# Patient Record
Sex: Male | Born: 1940 | Race: White | Hispanic: No | Marital: Married | Smoking: Heavy tobacco smoker
Health system: Southern US, Community
[De-identification: ages and names within clinical notes are randomized; demographics above are authoritative.]

## PROBLEM LIST (undated history)

## (undated) DIAGNOSIS — I10 Essential (primary) hypertension: Secondary | ICD-10-CM

## (undated) DIAGNOSIS — N529 Male erectile dysfunction, unspecified: Secondary | ICD-10-CM

## (undated) DIAGNOSIS — N400 Enlarged prostate without lower urinary tract symptoms: Secondary | ICD-10-CM

## (undated) DIAGNOSIS — C61 Malignant neoplasm of prostate: Secondary | ICD-10-CM

## (undated) DIAGNOSIS — E785 Hyperlipidemia, unspecified: Secondary | ICD-10-CM

## (undated) DIAGNOSIS — E119 Type 2 diabetes mellitus without complications: Secondary | ICD-10-CM

## (undated) HISTORY — DX: Essential (primary) hypertension: I10

## (undated) HISTORY — DX: Type 2 diabetes mellitus without complications: E11.9

## (undated) HISTORY — DX: Malignant neoplasm of prostate: C61

## (undated) HISTORY — PX: ROTATOR CUFF REPAIR: SHX139

## (undated) HISTORY — PX: PROSTATE SURGERY: SHX751

## (undated) HISTORY — DX: Hyperlipidemia, unspecified: E78.5

## (undated) HISTORY — DX: Benign prostatic hyperplasia without lower urinary tract symptoms: N40.0

## (undated) HISTORY — DX: Male erectile dysfunction, unspecified: N52.9

---

## 2003-01-06 ENCOUNTER — Encounter: Payer: Self-pay | Admitting: Orthopedic Surgery

## 2003-01-06 ENCOUNTER — Encounter: Admission: RE | Admit: 2003-01-06 | Discharge: 2003-01-06 | Payer: Self-pay | Admitting: Orthopedic Surgery

## 2010-07-16 ENCOUNTER — Ambulatory Visit: Admission: RE | Admit: 2010-07-16 | Discharge: 2010-09-08 | Payer: Self-pay | Admitting: Radiation Oncology

## 2010-10-12 LAB — HM DIABETES EYE EXAM

## 2011-03-13 DIAGNOSIS — C61 Malignant neoplasm of prostate: Secondary | ICD-10-CM

## 2011-03-13 HISTORY — DX: Malignant neoplasm of prostate: C61

## 2011-07-13 LAB — FECAL OCCULT BLOOD, GUAIAC: Fecal Occult Blood: NEGATIVE

## 2012-03-12 LAB — HM COLONOSCOPY: HM Colonoscopy: NEGATIVE

## 2012-09-23 ENCOUNTER — Ambulatory Visit: Payer: Self-pay | Admitting: Urology

## 2012-10-26 LAB — HM DIABETES FOOT EXAM

## 2012-10-26 LAB — BASIC METABOLIC PANEL
BUN: 18 mg/dL (ref 4–21)
Creatinine: 0.8 mg/dL (ref <=1.3)

## 2012-10-26 LAB — LIPID PANEL
HDL: 42 mg/dL (ref 35–70)
LDL Cholesterol: 53 mg/dL
Triglycerides: 77 mg/dL (ref 40–160)

## 2012-10-26 LAB — HEMOGLOBIN A1C: Hgb A1c MFr Bld: 7 % — AB (ref 4.0–6.0)

## 2012-12-17 ENCOUNTER — Encounter: Payer: Self-pay | Admitting: Nurse Practitioner

## 2012-12-17 DIAGNOSIS — N529 Male erectile dysfunction, unspecified: Secondary | ICD-10-CM

## 2012-12-17 DIAGNOSIS — E785 Hyperlipidemia, unspecified: Secondary | ICD-10-CM | POA: Insufficient documentation

## 2012-12-17 DIAGNOSIS — I10 Essential (primary) hypertension: Secondary | ICD-10-CM | POA: Insufficient documentation

## 2012-12-17 DIAGNOSIS — E119 Type 2 diabetes mellitus without complications: Secondary | ICD-10-CM

## 2012-12-17 DIAGNOSIS — N400 Enlarged prostate without lower urinary tract symptoms: Secondary | ICD-10-CM | POA: Insufficient documentation

## 2013-01-04 ENCOUNTER — Telehealth: Payer: Self-pay | Admitting: Nurse Practitioner

## 2013-01-04 NOTE — Telephone Encounter (Signed)
Wants verbal to fill topical cream for neuropathy.

## 2013-01-25 ENCOUNTER — Ambulatory Visit: Payer: Self-pay | Admitting: Nurse Practitioner

## 2013-04-25 ENCOUNTER — Other Ambulatory Visit: Payer: Self-pay | Admitting: Nurse Practitioner

## 2013-04-27 NOTE — Telephone Encounter (Signed)
Last seen 10/26/12

## 2013-05-23 ENCOUNTER — Other Ambulatory Visit: Payer: Self-pay | Admitting: Nurse Practitioner

## 2013-05-24 ENCOUNTER — Telehealth: Payer: Self-pay | Admitting: Nurse Practitioner

## 2013-05-24 NOTE — Telephone Encounter (Signed)
Wants 90 day supply, hasnt been seen since 01/14

## 2013-05-25 NOTE — Telephone Encounter (Signed)
i reviewed patients paper chart and I do not see where patient is taking Lipitor. I left message for patient to call us back

## 2013-05-25 NOTE — Telephone Encounter (Signed)
Need to know dose- not in epic 

## 2013-05-29 ENCOUNTER — Ambulatory Visit (INDEPENDENT_AMBULATORY_CARE_PROVIDER_SITE_OTHER): Payer: Medicare Other | Admitting: Nurse Practitioner

## 2013-05-29 ENCOUNTER — Encounter: Payer: Self-pay | Admitting: Nurse Practitioner

## 2013-05-29 VITALS — BP 115/70 | HR 72 | Temp 97.4°F | Ht 71.0 in | Wt 176.0 lb

## 2013-05-29 DIAGNOSIS — M25569 Pain in unspecified knee: Secondary | ICD-10-CM

## 2013-05-29 DIAGNOSIS — Z125 Encounter for screening for malignant neoplasm of prostate: Secondary | ICD-10-CM

## 2013-05-29 DIAGNOSIS — E785 Hyperlipidemia, unspecified: Secondary | ICD-10-CM

## 2013-05-29 DIAGNOSIS — I10 Essential (primary) hypertension: Secondary | ICD-10-CM

## 2013-05-29 DIAGNOSIS — E119 Type 2 diabetes mellitus without complications: Secondary | ICD-10-CM

## 2013-05-29 DIAGNOSIS — M25562 Pain in left knee: Secondary | ICD-10-CM

## 2013-05-29 DIAGNOSIS — L989 Disorder of the skin and subcutaneous tissue, unspecified: Secondary | ICD-10-CM

## 2013-05-29 MED ORDER — LISINOPRIL 5 MG PO TABS: 5.0000 mg | ORAL_TABLET | Freq: Every day | ORAL | Status: DC

## 2013-05-29 MED ORDER — SITAGLIPTIN PHOS-METFORMIN HCL 50-1000 MG PO TABS: 1.0000 | ORAL_TABLET | Freq: Two times a day (BID) | ORAL | Status: DC

## 2013-05-29 MED ORDER — GEMFIBROZIL 600 MG PO TABS: 600.0000 mg | ORAL_TABLET | Freq: Two times a day (BID) | ORAL | Status: DC

## 2013-05-29 MED ORDER — GLIMEPIRIDE 4 MG PO TABS: 4.0000 mg | ORAL_TABLET | Freq: Every day | ORAL | Status: DC

## 2013-05-29 NOTE — Progress Notes (Signed)
Subjective:    Patient ID: Gabriel Joyce, male    DOB: Nov 05, 1940, 72 y.o.   MRN: [id number]  Hypertension This is a chronic problem. The current episode started more than 1 year ago. The problem is unchanged. The problem is controlled. Pertinent negatives include no blurred vision, chest pain, headaches, neck pain, palpitations, peripheral edema, shortness of breath or sweats. There are no associated agents to hypertension. Risk factors for coronary artery disease include diabetes mellitus and smoking/tobacco exposure. Past treatments include ACE inhibitors. The current treatment provides moderate improvement. Compliance problems include diet and exercise.  Hypertensive end-organ damage includes CAD/MI.  Hyperlipidemia This is a chronic problem. The current episode started more than 1 year ago. The problem is controlled. Recent lipid tests were reviewed and are normal. Exacerbating diseases include diabetes. He has no history of hypothyroidism, liver disease or obesity. Pertinent negatives include no chest pain or shortness of breath. Current antihyperlipidemic treatment includes fibric acid derivatives. The current treatment provides moderate improvement of lipids. Compliance problems include adherence to diet and adherence to exercise.  Risk factors for coronary artery disease include diabetes mellitus, hypertension and male sex.  Diabetes He presents for his follow-up diabetic visit. He has type 2 diabetes mellitus. No MedicAlert identification noted. The initial diagnosis of diabetes was made 24 years ago. His disease course has been fluctuating. There are no hypoglycemic associated symptoms. Pertinent negatives for hypoglycemia include no headaches or sweats. There are no diabetic associated symptoms. Pertinent negatives for diabetes include no blurred vision and no chest pain. There are no hypoglycemic complications. There are no diabetic complications. Risk factors for coronary artery disease  include dyslipidemia, hypertension, male sex and family history. Current diabetic treatment includes oral agent (triple therapy). He is compliant with treatment most of the time. His weight is stable. He is following a generally healthy diet. When asked about meal planning, he reported none. He has not had a previous visit with a dietician. He rarely participates in exercise. There is no change in his home blood glucose trend. His breakfast blood glucose is taken between 8-9 am. His breakfast blood glucose range is generally 90-110 mg/dl. His overall blood glucose range is 90-110 mg/dl. An ACE inhibitor/angiotensin II receptor blocker is being taken. He does not see a podiatrist.Eye exam is not current (patient to make appointment).      Review of Systems  HENT: Negative for neck pain.   Eyes: Negative for blurred vision.  Respiratory: Negative for shortness of breath.   Cardiovascular: Negative for chest pain and palpitations.  Neurological: Negative for headaches.       Objective:   Physical Exam  Constitutional: He is oriented to person, place, and time. He appears well-developed and well-nourished.  HENT:  Head: Normocephalic.  Right Ear: External ear normal.  Left Ear: External ear normal.  Nose: Nose normal.  Mouth/Throat: Oropharynx is clear and moist.  Eyes: EOM are normal. Pupils are equal, round, and reactive to light.  Neck: Normal range of motion. Neck supple. No thyromegaly present.  Cardiovascular: Normal rate, regular rhythm, normal heart sounds and intact distal pulses.   No murmur heard. Pulmonary/Chest: Effort normal and breath sounds normal. He has no wheezes. He has no rales.  Abdominal: Soft. Bowel sounds are normal.  Genitourinary:  Refused rectal exam a day   Musculoskeletal: Normal range of motion.  FROM of left knee with pain on full ext- mild crepitus No effusion No patella tenderness  Neurological: He is alert and oriented to  person, place, and time.   Skin: Skin is warm and dry.  1cm raised fleshed colored lesion on left side of nose  Psychiatric: He has a normal mood and affect. His behavior is normal. Judgment and thought content normal.   BP 115/70  Pulse 72  Temp(Src) 97.4 F (36.3 C) (Oral)  Ht 5\' 11"  (1.803 m)  Wt 176 lb (79.833 kg)  BMI 24.56 kg/m2        Assessment & Plan:  1. Hypertension Low NA + diet - CMP14+EGFR  2. Hyperlipidemia Low fat diet and exercise - NMR, lipoprofile  3. Type II or unspecified type diabetes mellitus without mention of complication, not stated as uncontrolled Low carb diet - POCT glycosylated hemoglobin (Hb A1C) - POCT UA - Microalbumin  4. Prostate cancer screening  - PSA, total and free  Referral to ortho  For left knee pain Referral to dermatolgy for skin lesion  Mary-Margaret Daphine Deutscher, FNP

## 2013-05-29 NOTE — Patient Instructions (Signed)
Health Maintenance, Males A healthy lifestyle and preventative care can promote health and wellness.  Maintain regular health, dental, and eye exams.  Eat a healthy diet. Foods like vegetables, fruits, whole grains, low-fat dairy products, and lean protein foods contain the nutrients you need without too many calories. Decrease your intake of foods high in solid fats, added sugars, and salt. Get information about a proper diet from your caregiver, if necessary.  Regular physical exercise is one of the most important things you can do for your health. Most adults should get at least 150 minutes of moderate-intensity exercise (any activity that increases your heart rate and causes you to sweat) each week. In addition, most adults need muscle-strengthening exercises on 2 or more days a week.   Maintain a healthy weight. The body mass index (BMI) is a screening tool to identify possible weight problems. It provides an estimate of body fat based on height and weight. Your caregiver can help determine your BMI, and can help you achieve or maintain a healthy weight. For adults 20 years and older:  A BMI below 18.5 is considered underweight.  A BMI of 18.5 to 24.9 is normal.  A BMI of 25 to 29.9 is considered overweight.  A BMI of 30 and above is considered obese.  Maintain normal blood lipids and cholesterol by exercising and minimizing your intake of saturated fat. Eat a balanced diet with plenty of fruits and vegetables. Blood tests for lipids and cholesterol should begin at age 20 and be repeated every 5 years. If your lipid or cholesterol levels are high, you are over 50, or you are a high risk for heart disease, you may need your cholesterol levels checked more frequently.Ongoing high lipid and cholesterol levels should be treated with medicines, if diet and exercise are not effective.  If you smoke, find out from your caregiver how to quit. If you do not use tobacco, do not start.  If you  choose to drink alcohol, do not exceed 2 drinks per day. One drink is considered to be 12 ounces (355 mL) of beer, 5 ounces (148 mL) of wine, or 1.5 ounces (44 mL) of liquor.  Avoid use of street drugs. Do not share needles with anyone. Ask for help if you need support or instructions about stopping the use of drugs.  High blood pressure causes heart disease and increases the risk of stroke. Blood pressure should be checked at least every 1 to 2 years. Ongoing high blood pressure should be treated with medicines if weight loss and exercise are not effective.  If you are 45 to 70 years old, ask your caregiver if you should take aspirin to prevent heart disease.  Diabetes screening involves taking a blood sample to check your fasting blood sugar level. This should be done once every 3 years, after age 45, if you are within normal weight and without risk factors for diabetes. Testing should be considered at a younger age or be carried out more frequently if you are overweight and have at least 1 risk factor for diabetes.  Colorectal cancer can be detected and often prevented. Most routine colorectal cancer screening begins at the age of 50 and continues through age 75. However, your caregiver may recommend screening at an earlier age if you have risk factors for colon cancer. On a yearly basis, your caregiver may provide home test kits to check for hidden blood in the stool. Use of a small camera at the end of a tube,   to directly examine the colon (sigmoidoscopy or colonoscopy), can detect the earliest forms of colorectal cancer. Talk to your caregiver about this at age 50, when routine screening begins. Direct examination of the colon should be repeated every 5 to 10 years through age 75, unless early forms of pre-cancerous polyps or small growths are found.  Hepatitis C blood testing is recommended for all people born from 1945 through 1965 and any individual with known risks for hepatitis C.  Healthy  men should no longer receive prostate-specific antigen (PSA) blood tests as part of routine cancer screening. Consult with your caregiver about prostate cancer screening.  Testicular cancer screening is not recommended for adolescents or adult males who have no symptoms. Screening includes self-exam, caregiver exam, and other screening tests. Consult with your caregiver about any symptoms you have or any concerns you have about testicular cancer.  Practice safe sex. Use condoms and avoid high-risk sexual practices to reduce the spread of sexually transmitted infections (STIs).  Use sunscreen with a sun protection factor (SPF) of 30 or greater. Apply sunscreen liberally and repeatedly throughout the day. You should seek shade when your shadow is shorter than you. Protect yourself by wearing long sleeves, pants, a wide-brimmed hat, and sunglasses year round, whenever you are outdoors.  Notify your caregiver of new moles or changes in moles, especially if there is a change in shape or color. Also notify your caregiver if a mole is larger than the size of a pencil eraser.  A one-time screening for abdominal aortic aneurysm (AAA) and surgical repair of large AAAs by sound wave imaging (ultrasonography) is recommended for ages 65 to 75 years who are current or former smokers.  Stay current with your immunizations. Document Released: 03/26/2008 Document Revised: 12/21/2011 Document Reviewed: 02/23/2011 [employer] Patient Information 2014 [employer].  

## 2013-05-30 LAB — CMP14+EGFR
ALT: 6 IU/L (ref 0–44)
Albumin/Globulin Ratio: 1.6 (ref 1.1–2.5)
Albumin: 4.1 g/dL (ref 3.5–4.8)
GFR calc Af Amer: 104 mL/min/{1.73_m2} (ref >59)
GFR calc non Af Amer: 90 mL/min/{1.73_m2} (ref >59)
Globulin, Total: 2.5 g/dL (ref 1.5–4.5)
Glucose: 132 mg/dL — ABNORMAL HIGH (ref 65–99)
Potassium: 4.1 mmol/L (ref 3.5–5.2)
Total Bilirubin: 0.2 mg/dL (ref 0.0–1.2)
Total Protein: 6.6 g/dL (ref 6.0–8.5)

## 2013-05-30 LAB — NMR, LIPOPROFILE
Cholesterol: 147 mg/dL (ref <200)
HDL Particle Number: 29 umol/L — ABNORMAL LOW (ref >=30.5)
LDL Size: 20.1 nm — ABNORMAL LOW (ref >20.5)
Small LDL Particle Number: 853 nmol/L — ABNORMAL HIGH (ref <=527)
Triglycerides by NMR: 155 mg/dL — ABNORMAL HIGH (ref <150)

## 2013-05-30 LAB — PSA, TOTAL AND FREE
PSA, Free Pct: 10 %
PSA, Free: 0.2 ng/mL
PSA: 2 ng/mL (ref 0.0–4.0)

## 2013-06-02 ENCOUNTER — Telehealth: Payer: Self-pay | Admitting: Nurse Practitioner

## 2013-06-07 NOTE — Telephone Encounter (Signed)
PT AWARE REF TO BE MADE

## 2013-06-08 ENCOUNTER — Telehealth: Payer: Self-pay | Admitting: Nurse Practitioner

## 2013-08-30 ENCOUNTER — Ambulatory Visit: Payer: Medicare Other | Admitting: Nurse Practitioner

## 2013-11-08 ENCOUNTER — Encounter: Payer: Self-pay | Admitting: Nurse Practitioner

## 2013-11-08 ENCOUNTER — Ambulatory Visit (INDEPENDENT_AMBULATORY_CARE_PROVIDER_SITE_OTHER): Payer: Medicare HMO | Admitting: Nurse Practitioner

## 2013-11-08 VITALS — BP 127/73 | HR 80 | Temp 98.0°F | Ht 71.0 in | Wt 183.0 lb

## 2013-11-08 DIAGNOSIS — I10 Essential (primary) hypertension: Secondary | ICD-10-CM

## 2013-11-08 DIAGNOSIS — E119 Type 2 diabetes mellitus without complications: Secondary | ICD-10-CM

## 2013-11-08 DIAGNOSIS — N529 Male erectile dysfunction, unspecified: Secondary | ICD-10-CM

## 2013-11-08 DIAGNOSIS — L989 Disorder of the skin and subcutaneous tissue, unspecified: Secondary | ICD-10-CM

## 2013-11-08 DIAGNOSIS — E785 Hyperlipidemia, unspecified: Secondary | ICD-10-CM

## 2013-11-08 DIAGNOSIS — N400 Enlarged prostate without lower urinary tract symptoms: Secondary | ICD-10-CM

## 2013-11-08 LAB — POCT GLYCOSYLATED HEMOGLOBIN (HGB A1C): Hemoglobin A1C: 8.4

## 2013-11-08 MED ORDER — LISINOPRIL 5 MG PO TABS: 5.0000 mg | ORAL_TABLET | Freq: Every day | ORAL | Status: DC

## 2013-11-08 MED ORDER — GLIMEPIRIDE 4 MG PO TABS: 4.0000 mg | ORAL_TABLET | Freq: Every day | ORAL | Status: DC

## 2013-11-08 MED ORDER — SITAGLIPTIN PHOS-METFORMIN HCL 50-1000 MG PO TABS: 1.0000 | ORAL_TABLET | Freq: Two times a day (BID) | ORAL | Status: DC

## 2013-11-08 MED ORDER — GEMFIBROZIL 600 MG PO TABS: 600.0000 mg | ORAL_TABLET | Freq: Two times a day (BID) | ORAL | Status: DC

## 2013-11-08 NOTE — Progress Notes (Signed)
Subjective:    Patient ID: Gabriel Joyce, male    DOB: Jun 29, 1941, 73 y.o.   MRN: [id number]  Patient ere  Today for follow up- blood sugars not checked daily at home.  Hypertension This is a chronic problem. The current episode started more than 1 year ago. The problem is unchanged. The problem is controlled. Pertinent negatives include no blurred vision, chest pain, neck pain, palpitations, peripheral edema or sweats. There are no associated agents to hypertension. Risk factors for coronary artery disease include diabetes mellitus and smoking/tobacco exposure. Past treatments include ACE inhibitors. The current treatment provides moderate improvement. Compliance problems include diet and exercise.  Hypertensive end-organ damage includes CAD/MI.  Hyperlipidemia This is a chronic problem. The current episode started more than 1 year ago. The problem is controlled. Recent lipid tests were reviewed and are normal. Exacerbating diseases include diabetes. He has no history of hypothyroidism, liver disease or obesity. Pertinent negatives include no chest pain. Current antihyperlipidemic treatment includes fibric acid derivatives. The current treatment provides moderate improvement of lipids. Compliance problems include adherence to diet and adherence to exercise.  Risk factors for coronary artery disease include diabetes mellitus, hypertension and male sex.  Diabetes He presents for his follow-up diabetic visit. He has type 2 diabetes mellitus. No MedicAlert identification noted. The initial diagnosis of diabetes was made 24 years ago. His disease course has been fluctuating. There are no hypoglycemic associated symptoms. Pertinent negatives for hypoglycemia include no dizziness or sweats. There are no diabetic associated symptoms. Pertinent negatives for diabetes include no blurred vision, no chest pain and no fatigue. There are no hypoglycemic complications. There are no diabetic complications. Risk  factors for coronary artery disease include dyslipidemia, hypertension, male sex and family history. Current diabetic treatment includes oral agent (triple therapy). He is compliant with treatment most of the time. His weight is stable. He is following a generally healthy diet. When asked about meal planning, he reported none. He has not had a previous visit with a dietician. He rarely participates in exercise. There is no change in his home blood glucose trend. His breakfast blood glucose is taken between 8-9 am. His breakfast blood glucose range is generally 90-110 mg/dl. His overall blood glucose range is 90-110 mg/dl. An ACE inhibitor/angiotensin II receptor blocker is being taken. He does not see a podiatrist.Eye exam is not current (patient to make appointment).      Review of Systems  Constitutional: Negative for fever, activity change, appetite change and fatigue.  HENT: Negative for congestion and postnasal drip.   Eyes: Negative for blurred vision.  Respiratory: Negative for wheezing.   Cardiovascular: Negative for chest pain and palpitations.  Gastrointestinal: Negative for diarrhea and constipation.  Genitourinary: Positive for frequency.  Musculoskeletal: Negative for neck pain.  Skin: Negative for rash.  Neurological: Negative for dizziness and syncope.       Objective:   Physical Exam  Constitutional: He is oriented to person, place, and time. He appears well-developed and well-nourished.  HENT:  Head: Normocephalic.  Right Ear: External ear normal.  Left Ear: External ear normal.  Nose: Nose normal.  Mouth/Throat: Oropharynx is clear and moist.  Eyes: EOM are normal. Pupils are equal, round, and reactive to light.  Neck: Normal range of motion. Neck supple. No thyromegaly present.  Cardiovascular: Normal rate, regular rhythm, normal heart sounds and intact distal pulses.   No murmur heard. Pulmonary/Chest: Effort normal and breath sounds normal. He has no wheezes. He  has no rales.  Abdominal: Soft. Bowel sounds are normal.  Genitourinary:  Refused rectal exam a day   Musculoskeletal: Normal range of motion.  FROM of left knee with pain on full ext- mild crepitus No effusion No patella tenderness  Neurological: He is alert and oriented to person, place, and time.  Skin: Skin is warm and dry.  1cm raised fleshed colored lesion on left side of nose.  Two 1 cm scabbed area on right side of nose that have not healed over the last several months  Psychiatric: He has a normal mood and affect. His behavior is normal. Judgment and thought content normal.   Pt refused to have diabetic foot check because he has been working outside today.  [place] Nurse checked them this month at home and he does not need it.    BP 127/73  Pulse 80  Temp(Src) 98 F (36.7 C) (Oral)  Ht '5\' 11"'  (1.803 m)  Wt 183 lb (83.008 kg)  BMI 25.53 kg/m2  Results for orders placed in visit on 11/08/13  POCT GLYCOSYLATED HEMOGLOBIN (HGB A1C)      Result Value Range   Hemoglobin A1C 8.4         Assessment & Plan:   1. Diabetes mellitus   2. Hypertension   3. Hyperlipidemia   4. Type II or unspecified type diabetes mellitus without mention of complication, not stated as uncontrolled   5. ED (erectile dysfunction)   6. BPH (benign prostatic hyperplasia)    Orders Placed This Encounter  Procedures  . CMP14+EGFR  . NMR, lipoprofile  . POCT glycosylated hemoglobin (Hb A1C)   Meds ordered this encounter  Medications  . sitaGLIPtin-metformin (JANUMET) 50-1000 MG per tablet    Sig: Take 1 tablet by mouth 2 (two) times daily with a meal.    Dispense:  180 tablet    Refill:  1    Order Specific Question:  Supervising Provider    Answer:  Chipper Herb [[id number]]  . lisinopril (PRINIVIL,ZESTRIL) 5 MG tablet    Sig: Take 1 tablet (5 mg total) by mouth daily.    Dispense:  90 tablet    Refill:  1    Order Specific Question:  Supervising Provider    Answer:   Chipper Herb [[id number]]  . glimepiride (AMARYL) 4 MG tablet    Sig: Take 1 tablet (4 mg total) by mouth daily before breakfast.    Dispense:  90 tablet    Refill:  1    Order Specific Question:  Supervising Provider    Answer:  Chipper Herb [[id number]]  . gemfibrozil (LOPID) 600 MG tablet    Sig: Take 1 tablet (600 mg total) by mouth 2 (two) times daily before a meal.    Dispense:  90 tablet    Refill:  1    Order Specific Question:  Supervising Provider    Answer:  Chipper Herb [[id number]]  going to continue current diabetic meds for now- low carb diet discussed- keep diary daily blood sugars Labs pending Health maintenance reviewed Diet and exercise encouraged Continue all meds Follow up  In 3 months   Straughn, FNP

## 2013-11-08 NOTE — Patient Instructions (Signed)

## 2013-11-08 NOTE — Addendum Note (Signed)
Addended by: Chevis Pretty on: 11/08/2013 03:32 PM   Modules accepted: Orders

## 2013-11-10 LAB — CMP14+EGFR
A/G RATIO: 1.8 (ref 1.1–2.5)
ALT: 6 IU/L (ref 0–44)
AST: 6 IU/L (ref 0–40)
Albumin: 4.1 g/dL (ref 3.5–4.8)
Alkaline Phosphatase: 102 IU/L (ref 39–117)
BUN/Creatinine Ratio: 16 (ref 10–22)
BUN: 14 mg/dL (ref 8–27)
CALCIUM: 9.6 mg/dL (ref 8.6–10.2)
CO2: 26 mmol/L (ref 18–29)
CREATININE: 0.88 mg/dL (ref 0.76–1.27)
Chloride: 100 mmol/L (ref 97–108)
GFR, EST AFRICAN AMERICAN: 101 mL/min/{1.73_m2} (ref >59)
GFR, EST NON AFRICAN AMERICAN: 87 mL/min/{1.73_m2} (ref >59)
GLOBULIN, TOTAL: 2.3 g/dL (ref 1.5–4.5)
GLUCOSE: 262 mg/dL — AB (ref 65–99)
POTASSIUM: 4.9 mmol/L (ref 3.5–5.2)
SODIUM: 142 mmol/L (ref 134–144)
TOTAL PROTEIN: 6.4 g/dL (ref 6.0–8.5)

## 2013-11-10 LAB — NMR, LIPOPROFILE
CHOLESTEROL: 138 mg/dL (ref <200)
HDL CHOLESTEROL BY NMR: 41 mg/dL (ref >=40)
HDL PARTICLE NUMBER: 29.9 umol/L — AB (ref >=30.5)
LDL Particle Number: 1147 nmol/L — ABNORMAL HIGH (ref <1000)
LDL Size: 20.2 nm — ABNORMAL LOW (ref >20.5)
LDLC SERPL CALC-MCNC: 71 mg/dL (ref <100)
LP-IR Score: 62 — ABNORMAL HIGH (ref <=45)
SMALL LDL PARTICLE NUMBER: 780 nmol/L — AB (ref <=527)
TRIGLYCERIDES BY NMR: 131 mg/dL (ref <150)

## 2013-11-24 ENCOUNTER — Other Ambulatory Visit: Payer: Self-pay | Admitting: Nurse Practitioner

## 2013-12-19 ENCOUNTER — Telehealth: Payer: Self-pay | Admitting: *Deleted

## 2013-12-19 ENCOUNTER — Ambulatory Visit (INDEPENDENT_AMBULATORY_CARE_PROVIDER_SITE_OTHER): Payer: Medicare HMO | Admitting: Nurse Practitioner

## 2013-12-19 ENCOUNTER — Encounter: Payer: Self-pay | Admitting: Nurse Practitioner

## 2013-12-19 ENCOUNTER — Other Ambulatory Visit: Payer: Self-pay | Admitting: Nurse Practitioner

## 2013-12-19 VITALS — BP 133/75 | HR 87 | Temp 97.1°F | Ht 71.0 in | Wt 181.0 lb

## 2013-12-19 DIAGNOSIS — E119 Type 2 diabetes mellitus without complications: Secondary | ICD-10-CM

## 2013-12-19 LAB — POCT GLYCOSYLATED HEMOGLOBIN (HGB A1C): HEMOGLOBIN A1C: 9.1

## 2013-12-19 MED ORDER — MELOXICAM 15 MG PO TABS: ORAL_TABLET | ORAL | Status: DC

## 2013-12-19 NOTE — Telephone Encounter (Signed)
Mm, I called ins co and they have absolutely no idea what else to try Janumet is a tier 3 and jentadueto is a tier 3, komblize  Is a tier 4 and kazano is a tier 4.  All of these require step down therapy, I asked her what in the heck they are suppsed to step down from, she said she had no idea what, I told her you might just leave him on Janumet and  ask for a tier reduction and see if we can force the issue .  She said it looked like Janumet was as good a choice as they have.  Absolutely insane.  Sorry I'm giving you no help, but it's just not there.Marland Kitchen

## 2013-12-19 NOTE — Telephone Encounter (Signed)
How do we ask for a step down so he can get it- Hus Hgba1c is 9.7%

## 2013-12-19 NOTE — Progress Notes (Signed)
   Subjective:    Patient ID: Gabriel Joyce, male    DOB: 02-22-1941, 73 y.o.   MRN: [id number]  HPI  Patient here today to discuss changing one of his meds- he is currently on janumet 50/1000 bid- Insurance will not pay for this med and want him to change to something else.    Review of Systems     Objective:   Physical Exam   Results for orders placed in visit on 12/19/13  POCT GLYCOSYLATED HEMOGLOBIN (HGB A1C)      Result Value Ref Range   Hemoglobin A1C 9.1          Assessment & Plan:  Need to contact ins about med change.

## 2013-12-21 LAB — CMP14+EGFR
ALK PHOS: 111 IU/L (ref 39–117)
ALT: 6 IU/L (ref 0–44)
AST: 7 IU/L (ref 0–40)
Albumin/Globulin Ratio: 1.4 (ref 1.1–2.5)
Albumin: 4 g/dL (ref 3.5–4.8)
BUN / CREAT RATIO: 17 (ref 10–22)
BUN: 13 mg/dL (ref 8–27)
CALCIUM: 9.2 mg/dL (ref 8.6–10.2)
CHLORIDE: 96 mmol/L — AB (ref 97–108)
CO2: 21 mmol/L (ref 18–29)
CREATININE: 0.76 mg/dL (ref 0.76–1.27)
GFR calc Af Amer: 106 mL/min/{1.73_m2} (ref >59)
GFR calc non Af Amer: 92 mL/min/{1.73_m2} (ref >59)
Globulin, Total: 2.9 g/dL (ref 1.5–4.5)
Glucose: 327 mg/dL — ABNORMAL HIGH (ref 65–99)
Potassium: 4.3 mmol/L (ref 3.5–5.2)
Sodium: 137 mmol/L (ref 134–144)
Total Bilirubin: 0.2 mg/dL (ref 0.0–1.2)
Total Protein: 6.9 g/dL (ref 6.0–8.5)

## 2013-12-21 LAB — NMR, LIPOPROFILE
Cholesterol: 140 mg/dL (ref <200)
HDL CHOLESTEROL BY NMR: 42 mg/dL (ref >=40)
HDL Particle Number: 31.3 umol/L (ref >=30.5)
LDL PARTICLE NUMBER: 930 nmol/L (ref <1000)
LDL Size: 20.1 nm — ABNORMAL LOW (ref >20.5)
LDLC SERPL CALC-MCNC: 52 mg/dL (ref <100)
LP-IR SCORE: 66 — AB (ref <=45)
Small LDL Particle Number: 686 nmol/L — ABNORMAL HIGH (ref <=527)
Triglycerides by NMR: 232 mg/dL — ABNORMAL HIGH (ref <150)

## 2014-01-26 NOTE — Telephone Encounter (Signed)
Patient NTBS to discuss his meds please.

## 2014-01-26 NOTE — Telephone Encounter (Signed)
Talked with Gabriel Joyce , [name] not home she says he is non compliant she does not know if his taking anything for his diabetes or not.. The only thing we can possibly do is ask for a tier exception but I am pretty sure that they said you couldn't get a tier exception if there is no meds in lower tier.  Please let me know what you wish me to do.  Thanks

## 2014-01-31 NOTE — Telephone Encounter (Signed)
Tried to get in touch with pt no ans

## 2014-02-02 NOTE — Telephone Encounter (Signed)
Talked with Gabriel Joyce and he said he will make an appt.with MM to discuss his medication.

## 2014-02-06 ENCOUNTER — Telehealth: Payer: Self-pay | Admitting: Nurse Practitioner

## 2014-02-12 ENCOUNTER — Ambulatory Visit: Payer: Medicare HMO | Admitting: Nurse Practitioner

## 2014-02-12 ENCOUNTER — Other Ambulatory Visit: Payer: Self-pay | Admitting: Nurse Practitioner

## 2014-02-13 NOTE — Telephone Encounter (Signed)
Do not see on current med list. Please advise on refill

## 2014-02-21 NOTE — Telephone Encounter (Signed)
Detailed message left to call us if he still needs [place]

## 2014-03-19 ENCOUNTER — Other Ambulatory Visit: Payer: Self-pay | Admitting: *Deleted

## 2014-03-19 DIAGNOSIS — E119 Type 2 diabetes mellitus without complications: Secondary | ICD-10-CM

## 2014-03-19 MED ORDER — GLIMEPIRIDE 4 MG PO TABS: 4.0000 mg | ORAL_TABLET | Freq: Every day | ORAL | Status: DC

## 2014-03-19 MED ORDER — MELOXICAM 15 MG PO TABS: ORAL_TABLET | ORAL | Status: DC

## 2014-03-19 MED ORDER — TAMSULOSIN HCL 0.4 MG PO CAPS: ORAL_CAPSULE | ORAL | Status: DC

## 2014-07-12 ENCOUNTER — Other Ambulatory Visit: Payer: Self-pay | Admitting: Nurse Practitioner

## 2014-07-13 NOTE — Telephone Encounter (Signed)
Wants #90, last labs 04/15

## 2014-09-18 ENCOUNTER — Other Ambulatory Visit: Payer: Self-pay | Admitting: Nurse Practitioner

## 2014-11-08 ENCOUNTER — Other Ambulatory Visit: Payer: Self-pay | Admitting: Nurse Practitioner

## 2014-11-29 ENCOUNTER — Other Ambulatory Visit: Payer: Self-pay | Admitting: Nurse Practitioner

## 2014-12-03 NOTE — Telephone Encounter (Signed)
Last seen 12/19/13 MMM

## 2014-12-19 ENCOUNTER — Ambulatory Visit: Payer: Commercial Managed Care - HMO | Admitting: *Deleted

## 2014-12-19 DIAGNOSIS — Z23 Encounter for immunization: Secondary | ICD-10-CM

## 2014-12-19 NOTE — Patient Instructions (Signed)
Influenza Vaccine (Flu Vaccine, Inactivated or Recombinant) 2014-2015: What You Need to Know 1. Why get vaccinated? Influenza ("flu") is a contagious disease that spreads around the [place] every winter, usually between October and May. Flu is caused by influenza viruses, and is spread mainly by coughing, sneezing, and close contact. Anyone can get flu, but the risk of getting flu is highest among children. Symptoms come on suddenly and may last several days. They can include:  fever/chills  sore throat  muscle aches  fatigue  cough  headache  runny or stuffy nose Flu can make some people much sicker than others. These people include young children, people 65 and older, pregnant women, and people with certain health conditions-such as heart, lung or kidney disease, nervous system disorders, or a weakened immune system. Flu vaccination is especially important for these people, and anyone in close contact with them. Flu can also lead to pneumonia, and make existing medical conditions worse. It can cause diarrhea and seizures in children. Each year thousands of people in the [place] die from flu, and many more are hospitalized. Flu vaccine is the best protection against flu and its complications. Flu vaccine also helps prevent spreading flu from person to person. 2. Inactivated and recombinant flu vaccines You are getting an injectable flu vaccine, which is either an "inactivated" or "recombinant" vaccine. These vaccines do not contain any live influenza virus. They are given by injection with a needle, and often called the "flu shot."  A different live, attenuated (weakened) influenza vaccine is sprayed into the nostrils. This vaccine is described in a separate Vaccine Information Statement. Flu vaccination is recommended every year. Some children 6 months through 8 years of age might need two doses during one year. Flu viruses are always changing. Each year's flu vaccine is made  to protect against 3 or 4 viruses that are likely to cause disease that year. Flu vaccine cannot prevent all cases of flu, but it is the best defense against the disease.  It takes about 2 weeks for protection to develop after the vaccination, and protection lasts several months to a year. Some illnesses that are not caused by influenza virus are often mistaken for flu. Flu vaccine will not prevent these illnesses. It can only prevent influenza. Some inactivated flu vaccine contains a very small amount of a mercury-based preservative called thimerosal. Studies have shown that thimerosal in vaccines is not harmful, but flu vaccines that do not contain a preservative are available. 3. Some people should not get this vaccine Tell the person who gives you the vaccine:  If you have any severe, life-threatening allergies. If you ever had a life-threatening allergic reaction after a dose of flu vaccine, or have a severe allergy to any part of this vaccine, including (for example) an allergy to gelatin, antibiotics, or eggs, you may be advised not to get vaccinated. Most, but not all, types of flu vaccine contain a small amount of egg protein.  If you ever had Guillain-Barr Syndrome (a severe paralyzing illness, also called GBS). Some people with a history of GBS should not get this vaccine. This should be discussed with your doctor.  If you are not feeling well. It is usually okay to get flu vaccine when you have a mild illness, but you might be advised to wait until you feel better. You should come back when you are better. 4. Risks of a vaccine reaction With a vaccine, like any medicine, there is a chance of side   effects. These are usually mild and go away on their own. Problems that could happen after any vaccine:  Brief fainting spells can happen after any medical procedure, including vaccination. Sitting or lying down for about 15 minutes can help prevent fainting, and injuries caused by a fall. Tell  your doctor if you feel dizzy, or have vision changes or ringing in the ears.  Severe shoulder pain and reduced range of motion in the arm where a shot was given can happen, very rarely, after a vaccination.  Severe allergic reactions from a vaccine are very rare, estimated at less than 1 in a million doses. If one were to occur, it would usually be within a few minutes to a few hours after the vaccination. Mild problems following inactivated flu vaccine:  soreness, redness, or swelling where the shot was given  hoarseness  sore, red or itchy eyes  cough  fever  aches  headache  itching  fatigue If these problems occur, they usually begin soon after the shot and last 1 or 2 days. Moderate problems following inactivated flu vaccine:  Young children who get inactivated flu vaccine and pneumococcal vaccine (PCV13) at the same time may be at increased risk for seizures caused by fever. Ask your doctor for more information. Tell your doctor if a child who is getting flu vaccine has ever had a seizure. Inactivated flu vaccine does not contain live flu virus, so you cannot get the flu from this vaccine. As with any medicine, there is a very remote chance of a vaccine causing a serious injury or death. The safety of vaccines is always being monitored. For more information, visit: www.cdc.gov/vaccinesafety/ 5. What if there is a serious reaction? What should I look for?  Look for anything that concerns you, such as signs of a severe allergic reaction, very high fever, or behavior changes. Signs of a severe allergic reaction can include hives, swelling of the face and throat, difficulty breathing, a fast heartbeat, dizziness, and weakness. These would start a few minutes to a few hours after the vaccination. What should I do?  If you think it is a severe allergic reaction or other emergency that can't wait, call 9-1-1 and get the person to the nearest hospital. Otherwise, call your  doctor.  Afterward, the reaction should be reported to the Vaccine Adverse Event Reporting System (VAERS). Your doctor should file this report, or you can do it yourself through the VAERS web site at www.vaers.hhs.gov, or by calling 1-800-822-7967. VAERS does not give medical advice. 6. The National Vaccine Injury Compensation Program The National Vaccine Injury Compensation Program (VICP) is a federal program that was created to compensate people who may have been injured by certain vaccines. Persons who believe they may have been injured by a vaccine can learn about the program and about filing a claim by calling 1-800-338-2382 or visiting the VICP website at www.hrsa.gov/vaccinecompensation. There is a time limit to file a claim for compensation. 7. How can I learn more?  Ask your health care provider.  Call your local or state health department.  Contact the Centers for Disease Control and Prevention (CDC):  Call 1-800-232-4636 (1-800-CDC-INFO) or  Visit CDC's website at www.cdc.gov/flu CDC Vaccine Information Statement (Interim) Inactivated Influenza Vaccine (05/30/2013) Document Released: 07/23/2006 Document Revised: 02/12/2014 Document Reviewed: 09/15/2013 [employer] Patient Information 2015 [employer]. This information is not intended to replace advice given to you by your health care provider. Make sure you discuss any questions you have with your health   care provider. Shingles Vaccine What You Need to Know WHAT IS SHINGLES?  Shingles is a painful skin rash, often with blisters. It is also called Herpes Zoster or just Zoster.  A shingles rash usually appears on one side of the face or body and lasts from 2 to 4 weeks. Its main symptom is pain, which can be quite severe. Other symptoms of shingles can include fever, headache, chills, and upset stomach. Very rarely, a shingles infection can lead to pneumonia, hearing problems, blindness, brain inflammation (encephalitis), or  death.  For about 1 person in 5, severe pain can continue even after the rash clears up. This is called post-herpetic neuralgia.  Shingles is caused by the Varicella Zoster virus. This is the same virus that causes chickenpox. Only someone who has had a case of chickenpox or rarely, has gotten chickenpox vaccine, can get shingles. The virus stays in your body. It can reappear many years later to cause a case of shingles.  You cannot catch shingles from another person with shingles. However, a person who has never had chickenpox (or chickenpox vaccine) could get chickenpox from someone with shingles. This is not very common.  Shingles is far more common in people 60 and older than in younger people. It is also more common in people whose immune systems are weakened because of a disease such as cancer or drugs such as steroids or chemotherapy.  At least 1 million people get shingles per year in the [place]. SHINGLES VACCINE  A vaccine for shingles was licensed in 3086. In clinical trials, the vaccine reduced the risk of shingles by 50%. It can also reduce the pain in people who still get shingles after being vaccinated.  A single dose of shingles vaccine is recommended for adults 23 years of age and older. SOME PEOPLE SHOULD NOT GET SHINGLES VACCINE OR SHOULD WAIT A person should not get shingles vaccine if he or she:  Has ever had a life-threatening allergic reaction to gelatin, the antibiotic neomycin, or any other component of shingles vaccine. Tell your caregiver if you have any severe allergies.  Has a weakened immune system because of current:  AIDS or another disease that affects the immune system.  Treatment with drugs that affect the immune system, such as prolonged use of high-dose steroids.  Cancer treatment, such as radiation or chemotherapy.  Cancer affecting the bone marrow or lymphatic system, such as leukemia or lymphoma.  Is pregnant, or might be pregnant. Women  should not become pregnant until at least 4 weeks after getting shingles vaccine. Someone with a minor illness, such as a cold, may be vaccinated. Anyone with a moderate or severe acute illness should usually wait until he or she recovers before getting the vaccine. This includes anyone with a temperature of 101.3 F (38 C) or higher. WHAT ARE THE RISKS FROM SHINGLES VACCINE?  A vaccine, like any medicine, could possibly cause serious problems, such as severe allergic reactions. However, the risk of a vaccine causing serious harm, or death, is extremely small.  No serious problems have been identified with shingles vaccine. Mild Problems  Redness, soreness, swelling, or itching at the site of the injection (about 1 person in 3).  Headache (about 1 person in 31). Like all vaccines, shingles vaccine is being closely monitored for unusual or severe problems. WHAT IF THERE IS A MODERATE OR SEVERE REACTION? What should I look for? Any unusual condition, such as a severe allergic reaction or a high fever. If a  severe allergic reaction occurred, it would be within a few minutes to an hour after the shot. Signs of a serious allergic reaction can include difficulty breathing, weakness, hoarseness or wheezing, a fast heartbeat, hives, dizziness, paleness, or swelling of the throat. What should I do?  Call your caregiver, or get the person to a caregiver right away.  Tell the caregiver what happened, the date and time it happened, and when the vaccination was given.  Ask the caregiver to report the reaction by filing a Vaccine Adverse Event Reporting System (VAERS) form. Or, you can file this report through the VAERS web site at www.vaers.SamedayNews.es or by calling (540)530-3323. VAERS does not provide medical advice. HOW CAN I LEARN MORE?  Ask your caregiver. He or she can give you the vaccine package insert or suggest other sources of information.  Contact the Centers for Disease Control and  Prevention (CDC):  Call (480)668-4958 (1-800-CDC-INFO).  Visit the CDC website at http://hunter.com/ CDC Shingles Vaccine VIS (07/17/08) Document Released: 07/26/2006 Document Revised: 12/21/2011 Document Reviewed: 01/18/2013 [facility] Patient Information 2015 [place]. This information is not intended to replace advice given to you by your health care provider. Make sure you discuss any questions you have with your health care provider.

## 2014-12-19 NOTE — Progress Notes (Signed)
Pt given zostavax SubQ right upper arm, pt tolerated injection well.

## 2015-01-14 ENCOUNTER — Ambulatory Visit: Payer: Commercial Managed Care - HMO | Admitting: Nurse Practitioner

## 2015-02-04 ENCOUNTER — Encounter: Payer: Self-pay | Admitting: Nurse Practitioner

## 2015-02-04 ENCOUNTER — Ambulatory Visit (INDEPENDENT_AMBULATORY_CARE_PROVIDER_SITE_OTHER): Payer: Commercial Managed Care - HMO | Admitting: Nurse Practitioner

## 2015-02-04 VITALS — BP 123/79 | HR 83 | Temp 97.1°F | Ht 71.0 in | Wt 177.0 lb

## 2015-02-04 DIAGNOSIS — N400 Enlarged prostate without lower urinary tract symptoms: Secondary | ICD-10-CM

## 2015-02-04 DIAGNOSIS — I10 Essential (primary) hypertension: Secondary | ICD-10-CM | POA: Diagnosis not present

## 2015-02-04 DIAGNOSIS — E119 Type 2 diabetes mellitus without complications: Secondary | ICD-10-CM

## 2015-02-04 DIAGNOSIS — E1165 Type 2 diabetes mellitus with hyperglycemia: Secondary | ICD-10-CM

## 2015-02-04 DIAGNOSIS — E785 Hyperlipidemia, unspecified: Secondary | ICD-10-CM | POA: Diagnosis not present

## 2015-02-04 LAB — POCT UA - MICROALBUMIN: Microalbumin Ur, POC: 50 mg/L

## 2015-02-04 LAB — POCT GLYCOSYLATED HEMOGLOBIN (HGB A1C): HEMOGLOBIN A1C: 11.8

## 2015-02-04 LAB — GLUCOSE, POCT (MANUAL RESULT ENTRY): POC Glucose: 243 mg/dl — AB (ref 70–99)

## 2015-02-04 MED ORDER — LISINOPRIL 5 MG PO TABS: 5.0000 mg | ORAL_TABLET | Freq: Every day | ORAL | Status: DC

## 2015-02-04 MED ORDER — GEMFIBROZIL 600 MG PO TABS: 600.0000 mg | ORAL_TABLET | Freq: Two times a day (BID) | ORAL | Status: DC

## 2015-02-04 MED ORDER — GLIMEPIRIDE 4 MG PO TABS: 4.0000 mg | ORAL_TABLET | Freq: Every day | ORAL | Status: DC

## 2015-02-04 MED ORDER — SITAGLIPTIN PHOS-METFORMIN HCL 50-1000 MG PO TABS
1.0000 | ORAL_TABLET | Freq: Two times a day (BID) | ORAL | Status: DC
Start: 2015-02-04 — End: 2015-09-10

## 2015-02-04 MED ORDER — TAMSULOSIN HCL 0.4 MG PO CAPS
ORAL_CAPSULE | ORAL | Status: DC
Start: 2015-02-04 — End: 2015-06-04

## 2015-02-04 MED ORDER — GLUCOSE BLOOD VI STRP: ORAL_STRIP | Status: DC

## 2015-02-04 NOTE — Progress Notes (Signed)
Subjective:    Patient ID: Gabriel Joyce, male    DOB: 26-Nov-1940, 74 y.o.   MRN: [id number]  Patient ere  Today for follow up- blood sugars not checked daily at home. Patient has been out of his medicine for awhile. Was cutting diabetec meds in half to make last longer.  Hypertension This is a chronic problem. The current episode started more than 1 year ago. The problem is unchanged. The problem is controlled. Pertinent negatives include no chest pain, neck pain or palpitations. Risk factors for coronary artery disease include dyslipidemia and male gender. Past treatments include ACE inhibitors. The current treatment provides moderate improvement. Compliance problems include diet and exercise.   Hyperlipidemia This is a chronic problem. The current episode started more than 1 year ago. Recent lipid tests were reviewed and are variable. Exacerbating diseases include diabetes. He has no history of hypothyroidism or obesity. Pertinent negatives include no chest pain. Current antihyperlipidemic treatment includes statins. The current treatment provides moderate improvement of lipids. Compliance problems include adherence to diet and adherence to exercise.  Risk factors for coronary artery disease include dyslipidemia, hypertension and male sex.  Diabetes He presents for his follow-up diabetic visit. He has type 2 diabetes mellitus. Pertinent negatives for hypoglycemia include no dizziness. Pertinent negatives for diabetes include no chest pain and no fatigue. Risk factors for coronary artery disease include dyslipidemia, hypertension and male sex. Current diabetic treatment includes oral agent (dual therapy). He is compliant with treatment all of the time. When asked about meal planning, he reported none. He has not had a previous visit with a dietitian. He never participates in exercise. (Does not check blood sugars at home) An ACE inhibitor/angiotensin II receptor blocker is being taken. He does not see  a podiatrist.Eye exam is not current.      Review of Systems  Constitutional: Negative for fever, activity change, appetite change and fatigue.  HENT: Negative for congestion and postnasal drip.   Respiratory: Negative for wheezing.   Cardiovascular: Negative for chest pain and palpitations.  Gastrointestinal: Negative for diarrhea and constipation.  Genitourinary: Positive for frequency.  Musculoskeletal: Negative for neck pain.  Skin: Negative for rash.  Neurological: Negative for dizziness and syncope.       Objective:   Physical Exam  Constitutional: He is oriented to person, place, and time. He appears well-developed and well-nourished.  HENT:  Head: Normocephalic.  Right Ear: External ear normal.  Left Ear: External ear normal.  Nose: Nose normal.  Mouth/Throat: Oropharynx is clear and moist.  Eyes: EOM are normal. Pupils are equal, round, and reactive to light.  Neck: Normal range of motion. Neck supple. No thyromegaly present.  Cardiovascular: Normal rate, regular rhythm, normal heart sounds and intact distal pulses.   No murmur heard. Pulmonary/Chest: Effort normal and breath sounds normal. He has no wheezes. He has no rales.  Abdominal: Soft. Bowel sounds are normal.  Genitourinary:  Refused rectal exam a day   Musculoskeletal: Normal range of motion.  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 Hurts to move.  Neurological: He is alert and oriented to person, place, and time.  Skin: Skin is warm and dry.  1cm raised fleshed colored lesion on left side of nose.  Two 1 cm scabbed area on right side of nose that have not healed over the last several months  Psychiatric: He has a normal mood and affect. His behavior is normal. Judgment and thought content normal.      BP  123/79 mmHg  Pulse 83  Temp(Src) 97.1 F (36.2 C) (Oral)  Ht '5\' 11"'  (1.803 m)  Wt 177 lb (80.287 kg)  BMI 24.70 kg/m2  Results for orders placed or performed in visit on 02/04/15  POCT  glycosylated hemoglobin (Hb A1C)  Result Value Ref Range   Hemoglobin A1C 11.8   POCT UA - Microalbumin  Result Value Ref Range   Microalbumin Ur, POC 50 mg/L  POCT glucose (manual entry)  Result Value Ref Range   POC Glucose 243 (A) 70 - 99 mg/dl        Assessment & Plan:  1. Type 2 diabetes mellitus with hypergylcemia DO  NOT STOP MEDS CHECK BLOOD SUGAR EVERY MORNING FASTING AND KEEP DIARY - POCT glycosylated hemoglobin (Hb A1C) - POCT UA - Microalbumin - Microalbumin, urine - POCT glucose (manual entry) - glucose blood (ONETOUCH VERIO) test strip; Test blood sugar every morning  And prn  E 11.3  Dispense: 100 each; Refill: 12 - sitaGLIPtin-metformin (JANUMET) 50-1000 MG per tablet; Take 1 tablet by mouth 2 (two) times daily with a meal.  Dispense: 180 tablet; Refill: 1 - glimepiride (AMARYL) 4 MG tablet; Take 1 tablet (4 mg total) by mouth daily before breakfast.  Dispense: 90 tablet; Refill: 1   2. Essential hypertension Do not add slat to diet - CMP14+EGFR - Microalbumin, urine - lisinopril (PRINIVIL,ZESTRIL) 5 MG tablet; Take 1 tablet (5 mg total) by mouth daily.  Dispense: 90 tablet; Refill: 1  3. Hyperlipidemia Low fat diet - NMR, lipoprofile - Microalbumin, urine - gemfibrozil (LOPID) 600 MG tablet; Take 1 tablet (600 mg total) by mouth 2 (two) times daily before a meal.  Dispense: 90 tablet; Refill: 1  4. BPH (benign prostatic hyperplasia) - tamsulosin (FLOMAX) 0.4 MG CAPS capsule; TAKE ONE CAPSULE BY MOUTH EVERY DAY  Dispense: 90 capsule; Refill: 0    Labs pending Health maintenance reviewed Diet and exercise encouraged Continue all meds Follow up  In 65month- will do chest x ray and EKG  Mary-Margaret MHassell Done FNP

## 2015-02-04 NOTE — Patient Instructions (Signed)

## 2015-02-05 LAB — CMP14+EGFR
ALBUMIN: 4.1 g/dL (ref 3.5–4.8)
ALT: 10 IU/L (ref 0–44)
AST: 10 IU/L (ref 0–40)
Albumin/Globulin Ratio: 1.5 (ref 1.1–2.5)
Alkaline Phosphatase: 124 IU/L — ABNORMAL HIGH (ref 39–117)
BUN/Creatinine Ratio: 17 (ref 10–22)
BUN: 13 mg/dL (ref 8–27)
Bilirubin Total: 0.2 mg/dL (ref 0.0–1.2)
CALCIUM: 9.6 mg/dL (ref 8.6–10.2)
CHLORIDE: 96 mmol/L — AB (ref 97–108)
CO2: 24 mmol/L (ref 18–29)
Creatinine, Ser: 0.77 mg/dL (ref 0.76–1.27)
GFR calc Af Amer: 105 mL/min/{1.73_m2} (ref >59)
GFR calc non Af Amer: 91 mL/min/{1.73_m2} (ref >59)
GLUCOSE: 275 mg/dL — AB (ref 65–99)
Globulin, Total: 2.8 g/dL (ref 1.5–4.5)
Potassium: 5 mmol/L (ref 3.5–5.2)
Sodium: 137 mmol/L (ref 134–144)
Total Protein: 6.9 g/dL (ref 6.0–8.5)

## 2015-02-05 LAB — NMR, LIPOPROFILE
CHOLESTEROL: 160 mg/dL (ref 100–199)
HDL Cholesterol by NMR: 37 mg/dL — ABNORMAL LOW (ref >39)
HDL Particle Number: 29.9 umol/L — ABNORMAL LOW (ref >=30.5)
LDL Particle Number: 1516 nmol/L — ABNORMAL HIGH (ref <1000)
LDL Size: 20.6 nm (ref >20.5)
LDL-C: 86 mg/dL (ref 0–99)
LP-IR Score: 66 — ABNORMAL HIGH (ref <=45)
Small LDL Particle Number: 952 nmol/L — ABNORMAL HIGH (ref <=527)
Triglycerides by NMR: 186 mg/dL — ABNORMAL HIGH (ref 0–149)

## 2015-02-05 LAB — MICROALBUMIN, URINE: Microalbumin, Urine: 49.2 ug/mL — ABNORMAL HIGH (ref 0.0–17.0)

## 2015-03-06 ENCOUNTER — Ambulatory Visit (INDEPENDENT_AMBULATORY_CARE_PROVIDER_SITE_OTHER): Payer: Commercial Managed Care - HMO | Admitting: Nurse Practitioner

## 2015-03-06 ENCOUNTER — Encounter: Payer: Self-pay | Admitting: Nurse Practitioner

## 2015-03-06 VITALS — BP 120/75 | HR 91 | Temp 97.7°F | Ht 69.0 in | Wt 183.4 lb

## 2015-03-06 DIAGNOSIS — E1365 Other specified diabetes mellitus with hyperglycemia: Secondary | ICD-10-CM | POA: Diagnosis not present

## 2015-03-06 DIAGNOSIS — N521 Erectile dysfunction due to diseases classified elsewhere: Secondary | ICD-10-CM | POA: Diagnosis not present

## 2015-03-06 DIAGNOSIS — E785 Hyperlipidemia, unspecified: Secondary | ICD-10-CM | POA: Diagnosis not present

## 2015-03-06 DIAGNOSIS — E119 Type 2 diabetes mellitus without complications: Secondary | ICD-10-CM

## 2015-03-06 LAB — POCT GLYCOSYLATED HEMOGLOBIN (HGB A1C): HEMOGLOBIN A1C: 10.8

## 2015-03-06 MED ORDER — SILDENAFIL CITRATE 50 MG PO TABS: 50.0000 mg | ORAL_TABLET | Freq: Every day | ORAL | Status: AC | PRN

## 2015-03-06 MED ORDER — GLUCOSE BLOOD VI STRP: ORAL_STRIP | Status: AC

## 2015-03-06 MED ORDER — GEMFIBROZIL 600 MG PO TABS: 600.0000 mg | ORAL_TABLET | Freq: Two times a day (BID) | ORAL | Status: DC

## 2015-03-06 NOTE — Progress Notes (Signed)
   Subjective:    Patient ID: Gabriel Joyce, male    DOB: 12/07/1940, 74 y.o.   MRN: [id number]  HPI  Patient here for one mont follow up of diabetes. Last visit patient had stopped taking his meds- Hgba1c was 11.4%- I refilled all his meds and told him NOT to stop any meds unless I tell him to. He has has been on his meds for the last month and blood sugars are some better. He has not checked blood sugars in the last 3 weeks because he ran out of strips. * C/o erectile dysfunction- wants viagra refill  *He ran out of [place] and has not taken in several months.  Review of Systems  Constitutional: Negative.   HENT: Negative.   Respiratory: Negative.   Cardiovascular: Negative.   Genitourinary: Negative.   Neurological: Negative.   Psychiatric/Behavioral: Negative.   All other systems reviewed and are negative.      Objective:   Physical Exam  Constitutional: He is oriented to person, place, and time. He appears well-developed and well-nourished.  Cardiovascular: Normal rate, regular rhythm and normal heart sounds.   Pulmonary/Chest: Effort normal and breath sounds normal.  Neurological: He is alert and oriented to person, place, and time.  Skin: Skin is warm.  Psychiatric: He has a normal mood and affect. His behavior is normal. Judgment and thought content normal.   BP 120/75 mmHg  Pulse 91  Temp(Src) 97.7 F (36.5 C) (Oral)  Ht 5\' 9"  (1.753 m)  Wt 183 lb 6.4 oz (83.19 kg)  BMI 27.07 kg/m2   Results for orders placed or performed in visit on 03/06/15  POCT glycosylated hemoglobin (Hb A1C)  Result Value Ref Range   Hemoglobin A1C 10.8            Assessment & Plan:  1. Other specified diabetes mellitus with hyperglycemia Strict carb counting - POCT glycosylated hemoglobin (Hb A1C) - glucose blood (ONETOUCH VERIO) test strip; Test blood sugar every morning  And prn  E 11.3  Dispense: 100 each; Refill: 12  2. 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  Dispense: 10 tablet; Refill: 2  3 Hyperlipidemia Low fat diet - gemfibrozil (LOPID) 600 MG tablet; Take 1 tablet (600 mg total) by mouth 2 (two) times daily before a meal.  Dispense: 90 tablet; Refill: 1  Recheck in 3 months  Mary-Margaret Hassell Done, FNP

## 2015-03-06 NOTE — Patient Instructions (Signed)

## 2015-06-04 ENCOUNTER — Ambulatory Visit (INDEPENDENT_AMBULATORY_CARE_PROVIDER_SITE_OTHER): Payer: Commercial Managed Care - HMO | Admitting: Nurse Practitioner

## 2015-06-04 ENCOUNTER — Encounter: Payer: Self-pay | Admitting: Nurse Practitioner

## 2015-06-04 VITALS — BP 114/67 | HR 84 | Temp 96.8°F | Ht 69.0 in | Wt 180.0 lb

## 2015-06-04 DIAGNOSIS — N521 Erectile dysfunction due to diseases classified elsewhere: Secondary | ICD-10-CM | POA: Diagnosis not present

## 2015-06-04 DIAGNOSIS — I10 Essential (primary) hypertension: Secondary | ICD-10-CM | POA: Diagnosis not present

## 2015-06-04 DIAGNOSIS — N400 Enlarged prostate without lower urinary tract symptoms: Secondary | ICD-10-CM

## 2015-06-04 DIAGNOSIS — Z23 Encounter for immunization: Secondary | ICD-10-CM

## 2015-06-04 DIAGNOSIS — E1165 Type 2 diabetes mellitus with hyperglycemia: Secondary | ICD-10-CM | POA: Diagnosis not present

## 2015-06-04 DIAGNOSIS — E785 Hyperlipidemia, unspecified: Secondary | ICD-10-CM | POA: Diagnosis not present

## 2015-06-04 DIAGNOSIS — E119 Type 2 diabetes mellitus without complications: Secondary | ICD-10-CM

## 2015-06-04 LAB — GLUCOSE, POCT (MANUAL RESULT ENTRY): POC Glucose: 242 mg/dl — AB (ref 70–99)

## 2015-06-04 LAB — POCT GLYCOSYLATED HEMOGLOBIN (HGB A1C): Hemoglobin A1C: 10.1

## 2015-06-04 MED ORDER — TAMSULOSIN HCL 0.4 MG PO CAPS: ORAL_CAPSULE | ORAL | Status: DC

## 2015-06-04 NOTE — Progress Notes (Signed)
Subjective:    Patient ID: Gabriel Joyce, male    DOB: 1940/11/03, 74 y.o.   MRN: [id number]    Patient here today for follow up of chronic medical problems  Hypertension This is a chronic problem. The current episode started more than 1 year ago. The problem is unchanged. The problem is controlled. Pertinent negatives include no chest pain, neck pain or palpitations. Risk factors for coronary artery disease include dyslipidemia and male gender. Past treatments include ACE inhibitors. The current treatment provides moderate improvement. Compliance problems include diet and exercise.   Hyperlipidemia This is a chronic problem. The current episode started more than 1 year ago. Recent lipid tests were reviewed and are variable. Exacerbating diseases include diabetes. He has no history of hypothyroidism or obesity. Pertinent negatives include no chest pain. Current antihyperlipidemic treatment includes statins. The current treatment provides moderate improvement of lipids. Compliance problems include adherence to diet and adherence to exercise.  Risk factors for coronary artery disease include dyslipidemia, hypertension and male sex.  Diabetes He presents for his follow-up diabetic visit. He has type 2 diabetes mellitus. Pertinent negatives for hypoglycemia include no dizziness. Pertinent negatives for diabetes include no chest pain and no fatigue. Risk factors for coronary artery disease include dyslipidemia, hypertension and male sex. Current diabetic treatment includes oral agent (dual therapy). He is compliant with treatment all of the time. When asked about meal planning, he reported none. He has not had a previous visit with a dietitian. He never participates in exercise. (Does not check blood sugars at home) An ACE inhibitor/angiotensin II receptor blocker is being taken. He does not see a podiatrist.Eye exam is not current.  BPH Currently on flomax- denies any trouble passing water ED Uses  viagra when needed. Has not used in awhile   Review of Systems  Constitutional: Negative.  Negative for fatigue.  HENT: Negative.   Cardiovascular: Negative for chest pain and palpitations.  Genitourinary: Negative.   Musculoskeletal: Negative.  Negative for neck pain.  Neurological: Negative for dizziness.  Psychiatric/Behavioral: Negative.   All other systems reviewed and are negative.      Objective:   Physical Exam  Constitutional: He is oriented to person, place, and time. He appears well-developed and well-nourished.  HENT:  Head: Normocephalic.  Right Ear: External ear normal.  Left Ear: External ear normal.  Nose: Nose normal.  Mouth/Throat: Oropharynx is clear and moist.  Cerumen noted to right ear. Unable to view TM  Eyes: Conjunctivae and EOM are normal. Pupils are equal, round, and reactive to light.  Neck: Normal range of motion.  Cardiovascular: Normal rate, regular rhythm, normal heart sounds and intact distal pulses.   Pulmonary/Chest: Effort normal and breath sounds normal.  Abdominal: Soft. Bowel sounds are normal.  Musculoskeletal: Normal range of motion.  Neurological: He is alert and oriented to person, place, and time.  Skin: Skin is warm and dry.  Psychiatric: He has a normal mood and affect. His behavior is normal. Thought content normal.     BP 114/67 mmHg  Pulse 84  Temp(Src) 96.8 F (36 C) (Oral)  Ht '5\' 9"'  (1.753 m)  Wt 180 lb (81.647 kg)  BMI 26.57 kg/m2  Results for orders placed or performed in visit on 06/04/15  POCT glycosylated hemoglobin (Hb A1C)  Result Value Ref Range   Hemoglobin A1C 10.1   POCT glucose (manual entry)  Result Value Ref Range   POC Glucose 242 (A) 70 - 99 mg/dl  Assessment & Plan:  1. Type 2 diabetes mellitus without complication Continue to watch carbs in diet referral to [place] need insulin or bydureon - POCT glycosylated hemoglobin (Hb A1C) - POCT glucose (manual entry)  2.  Hyperlipidemia Low fat diet - Lipid panel  3. Essential hypertension Do not add slat to diet - CMP14+EGFR  4. BPH (benign prostatic hyperplasia) - tamsulosin (FLOMAX) 0.4 MG CAPS capsule; TAKE ONE CAPSULE BY MOUTH EVERY DAY  Dispense: 90 capsule; Refill: 0  5. Erectile dysfunction due to diseases classified elsewhere    Labs pending Health maintenance reviewed Diet and exercise encouraged Continue all meds Follow up  In 20month   [place] FNP

## 2015-06-04 NOTE — Addendum Note (Signed)
Addended by: Rolena Infante on: 06/04/2015 10:54 AM   Modules accepted: Orders

## 2015-06-04 NOTE — Patient Instructions (Signed)

## 2015-06-05 ENCOUNTER — Ambulatory Visit (INDEPENDENT_AMBULATORY_CARE_PROVIDER_SITE_OTHER): Payer: Commercial Managed Care - HMO | Admitting: Pharmacist

## 2015-06-05 ENCOUNTER — Encounter: Payer: Self-pay | Admitting: Pharmacist

## 2015-06-05 VITALS — BP 122/76 | HR 80 | Ht 69.0 in | Wt 181.0 lb

## 2015-06-05 DIAGNOSIS — E119 Type 2 diabetes mellitus without complications: Secondary | ICD-10-CM | POA: Diagnosis not present

## 2015-06-05 LAB — LIPID PANEL
CHOLESTEROL TOTAL: 145 mg/dL (ref 100–199)
Chol/HDL Ratio: 4.3 ratio units (ref 0.0–5.0)
HDL: 34 mg/dL — ABNORMAL LOW (ref >39)
LDL CALC: 69 mg/dL (ref 0–99)
Triglycerides: 211 mg/dL — ABNORMAL HIGH (ref 0–149)
VLDL Cholesterol Cal: 42 mg/dL — ABNORMAL HIGH (ref 5–40)

## 2015-06-05 LAB — CMP14+EGFR
A/G RATIO: 1.6 (ref 1.1–2.5)
ALBUMIN: 4.2 g/dL (ref 3.5–4.8)
ALK PHOS: 99 IU/L (ref 39–117)
ALT: 8 IU/L (ref 0–44)
AST: 8 IU/L (ref 0–40)
BUN / CREAT RATIO: 16 (ref 10–22)
BUN: 14 mg/dL (ref 8–27)
CO2: 23 mmol/L (ref 18–29)
CREATININE: 0.89 mg/dL (ref 0.76–1.27)
Calcium: 9.5 mg/dL (ref 8.6–10.2)
Chloride: 99 mmol/L (ref 97–108)
GFR calc Af Amer: 99 mL/min/{1.73_m2} (ref >59)
GFR calc non Af Amer: 85 mL/min/{1.73_m2} (ref >59)
GLOBULIN, TOTAL: 2.7 g/dL (ref 1.5–4.5)
Glucose: 252 mg/dL — ABNORMAL HIGH (ref 65–99)
Potassium: 4.7 mmol/L (ref 3.5–5.2)
SODIUM: 143 mmol/L (ref 134–144)
Total Protein: 6.9 g/dL (ref 6.0–8.5)

## 2015-06-05 MED ORDER — INSULIN DEGLUDEC 100 UNIT/ML ~~LOC~~ SOPN: 10.0000 [IU] | PEN_INJECTOR | Freq: Every day | SUBCUTANEOUS | Status: DC

## 2015-06-05 MED ORDER — ONETOUCH LANCETS MISC: Status: AC

## 2015-06-05 NOTE — Progress Notes (Signed)
Patient ID: Gabriel Joyce, male   DOB: 1941/06/17, 74 y.o.   MRN: [id number] Subjective:    Gabriel Joyce is a 74 y.o. male who presents for an initial evaluation of Type 2 diabetes mellitus and education.  Patient was initially diagnosed in the 1990's with type 2 DM.  He is currently taking glimepiride 4mg  1 tablet daily and Janumet 50/1000mg  1 tablet bid with food.    Current symptoms/problems include hyperglycemia and have been improving but very little. Symptoms have been present for 3 months.  Known diabetic complications: none Cardiovascular risk factors: advanced age (older than 9 for men, 94 for women), diabetes mellitus, dyslipidemia, hypertension, male gender and smoking/ tobacco exposure  Eye exam current (within one year): unknown Weight trend: stable Prior visit with dietician: no Current diet: in general, an "unhealthy" diet Current exercise: none  Current monitoring regimen: none Patient has a glucometer at home but it is broken Any episodes of hypoglycemia? no  Is He on ACE inhibitor or angiotensin II receptor blocker?  Yes  lisinopril (Prinivil)    The following portions of the patient's history were reviewed and updated as appropriate: allergies, current medications, past family history, past medical history, past social history, past surgical history and problem list.    Objective:    BP 122/76 mmHg  Pulse 80  Ht 5\' 9"  (1.753 m)  Wt 181 lb (82.101 kg)  BMI 26.72 kg/m2  Lab Review GLUCOSE (mg/dL)  Date Value  06/04/2015 252*  02/04/2015 275*  12/19/2013 327*   CO2 (mmol/L)  Date Value  06/04/2015 23  02/04/2015 24  12/19/2013 21   BUN (mg/dL)  Date Value  06/04/2015 14  02/04/2015 13  12/19/2013 13   CREATININE (mg/dL)  Date Value  10/26/2012 0.8   CREATININE, SER (mg/dL)  Date Value  06/04/2015 0.89  02/04/2015 0.77  12/19/2013 0.76     A1c was 10.1% (06/04/2015) Previsou A1c was 10.8% (03/06/2015) Assessment:    Diabetes  Mellitus type II, under inadequate control. \   Plan:    1.  Rx changes: add tresiba U100 - inject 10 units SQ qam.  Patient instructed on how to inject insulin , selection of injections site, proper needles disposal.  First injection given in office today by patient. 2.  Education: Reviewed 'ABCs' of diabetes management (respective goals in parentheses):  A1C (<7), blood pressure (<130/80), and cholesterol (LDL <100). 3.  Compliance at present is estimated to be poor. Efforts to improve compliance (if necessary) will be directed at dietary modifications: rdiscussed CHO counting in depth, increased exercise and regular blood sugar monitoring: 1  times daily.  Patient given new glucometer - One Touch Verio Flex 4. Follow up: 3 weeks    Cherre Robins, PharmD, CPP

## 2015-06-12 ENCOUNTER — Telehealth: Payer: Self-pay | Admitting: Pharmacist

## 2015-06-12 NOTE — Telephone Encounter (Signed)
Opened encounter in error  

## 2015-06-26 ENCOUNTER — Ambulatory Visit: Payer: Commercial Managed Care - HMO | Admitting: Pharmacist

## 2015-07-01 ENCOUNTER — Telehealth: Payer: Self-pay | Admitting: Nurse Practitioner

## 2015-07-01 NOTE — Telephone Encounter (Signed)
Patient must request from [place] not the company.

## 2015-07-30 ENCOUNTER — Encounter: Payer: Self-pay | Admitting: Nurse Practitioner

## 2015-08-08 ENCOUNTER — Telehealth: Payer: Self-pay | Admitting: Nurse Practitioner

## 2015-08-08 MED ORDER — INSULIN DEGLUDEC 100 UNIT/ML ~~LOC~~ SOPN: 10.0000 [IU] | PEN_INJECTOR | Freq: Every day | SUBCUTANEOUS | Status: DC

## 2015-08-08 NOTE — Telephone Encounter (Signed)
Patient was suppose to follow up in September but missed appt.  I will send in Rx for #1 box of Tresiba.  He  has follow up with PCP 09/10/2015.  I would like to see him for DM follow up and AWV sooner if possible.  Tried to call patient - no answer.

## 2015-08-09 MED ORDER — PEN NEEDLES 31G X 6 MM MISC: Status: AC

## 2015-08-12 ENCOUNTER — Telehealth: Payer: Self-pay | Admitting: Pharmacist

## 2015-08-12 NOTE — Telephone Encounter (Signed)
Maureen from [place] reports that patient has not picked up [place] yet.  She states that cost to patient is $440.   I tried to contact patient about giving him another sample until his next appt when we can discuss other options (maybe Lantus or Levemir).  I tried to call number we had which was the same number that [facility] has on file with patient and again I was told "not here" and was hung up on.  All emergency contact have this same number.

## 2015-08-12 NOTE — Telephone Encounter (Signed)
I called [facility] and left message to call me if patient has not picked up Rx for [place].

## 2015-09-10 ENCOUNTER — Ambulatory Visit (INDEPENDENT_AMBULATORY_CARE_PROVIDER_SITE_OTHER): Payer: Commercial Managed Care - HMO | Admitting: Nurse Practitioner

## 2015-09-10 ENCOUNTER — Encounter: Payer: Self-pay | Admitting: Nurse Practitioner

## 2015-09-10 VITALS — BP 121/77 | HR 83 | Temp 96.9°F | Ht 69.0 in | Wt 179.0 lb

## 2015-09-10 DIAGNOSIS — N521 Erectile dysfunction due to diseases classified elsewhere: Secondary | ICD-10-CM | POA: Diagnosis not present

## 2015-09-10 DIAGNOSIS — N400 Enlarged prostate without lower urinary tract symptoms: Secondary | ICD-10-CM | POA: Diagnosis not present

## 2015-09-10 DIAGNOSIS — E119 Type 2 diabetes mellitus without complications: Secondary | ICD-10-CM

## 2015-09-10 DIAGNOSIS — Z1212 Encounter for screening for malignant neoplasm of rectum: Secondary | ICD-10-CM

## 2015-09-10 DIAGNOSIS — Z6826 Body mass index (BMI) 26.0-26.9, adult: Secondary | ICD-10-CM | POA: Diagnosis not present

## 2015-09-10 DIAGNOSIS — Z23 Encounter for immunization: Secondary | ICD-10-CM

## 2015-09-10 DIAGNOSIS — E785 Hyperlipidemia, unspecified: Secondary | ICD-10-CM

## 2015-09-10 DIAGNOSIS — E1165 Type 2 diabetes mellitus with hyperglycemia: Secondary | ICD-10-CM | POA: Diagnosis not present

## 2015-09-10 DIAGNOSIS — I10 Essential (primary) hypertension: Secondary | ICD-10-CM

## 2015-09-10 LAB — POCT GLYCOSYLATED HEMOGLOBIN (HGB A1C): HEMOGLOBIN A1C: 8.7

## 2015-09-10 MED ORDER — GLIMEPIRIDE 4 MG PO TABS: 4.0000 mg | ORAL_TABLET | Freq: Every day | ORAL | Status: DC

## 2015-09-10 MED ORDER — MELOXICAM 15 MG PO TABS: ORAL_TABLET | ORAL | Status: AC

## 2015-09-10 MED ORDER — LISINOPRIL 5 MG PO TABS: 5.0000 mg | ORAL_TABLET | Freq: Every day | ORAL | Status: DC

## 2015-09-10 MED ORDER — GEMFIBROZIL 600 MG PO TABS: 600.0000 mg | ORAL_TABLET | Freq: Two times a day (BID) | ORAL | Status: DC

## 2015-09-10 MED ORDER — SITAGLIPTIN PHOS-METFORMIN HCL 50-1000 MG PO TABS: 1.0000 | ORAL_TABLET | Freq: Two times a day (BID) | ORAL | Status: DC

## 2015-09-10 MED ORDER — TAMSULOSIN HCL 0.4 MG PO CAPS: ORAL_CAPSULE | ORAL | Status: DC

## 2015-09-10 NOTE — Progress Notes (Addendum)
Subjective:    Patient ID: Gabriel Joyce, male    DOB: [birth year], 74 y.o.   MRN: [id number]    Patient here today for follow up of chronic medical problems  Hypertension This is a chronic problem. The current episode started more than 1 year ago. The problem is unchanged. The problem is controlled. Pertinent negatives include no chest pain, neck pain or palpitations. Risk factors for coronary artery disease include dyslipidemia and male gender. Past treatments include ACE inhibitors. The current treatment provides moderate improvement. Compliance problems include diet and exercise.   Hyperlipidemia This is a chronic problem. The current episode started more than 1 year ago. Recent lipid tests were reviewed and are variable. Exacerbating diseases include diabetes. He has no history of hypothyroidism or obesity. Pertinent negatives include no chest pain. Current antihyperlipidemic treatment includes statins. The current treatment provides moderate improvement of lipids. Compliance problems include adherence to diet and adherence to exercise.  Risk factors for coronary artery disease include dyslipidemia, hypertension and male sex.  Diabetes He presents for his follow-up diabetic visit. He has type 2 diabetes mellitus. Pertinent negatives for hypoglycemia include no dizziness. Pertinent negatives for diabetes include no chest pain and no fatigue. Risk factors for coronary artery disease include dyslipidemia, hypertension and male sex. Current diabetic treatment includes oral agent (dual therapy). He is compliant with treatment all of the time. When asked about meal planning, he reported none. He has not had a previous visit with a dietitian. He never participates in exercise. (Does not check blood sugars at home) An ACE inhibitor/angiotensin II receptor blocker is being taken. He does not see a podiatrist.Eye exam is not current.  BPH Currently on flomax- denies any trouble passing water ED Uses  viagra when needed. Has not used in awhile   Review of Systems  Constitutional: Negative.  Negative for fatigue.  HENT: Negative.   Cardiovascular: Negative for chest pain and palpitations.  Genitourinary: Negative.   Musculoskeletal: Negative.  Negative for neck pain.  Neurological: Negative for dizziness.  Psychiatric/Behavioral: Negative.   All other systems reviewed and are negative.      Objective:   Physical Exam  Constitutional: He is oriented to person, place, and time. He appears well-developed and well-nourished.  HENT:  Head: Normocephalic.  Right Ear: External ear normal.  Left Ear: External ear normal.  Nose: Nose normal.  Mouth/Throat: Oropharynx is clear and moist.  Cerumen noted to right ear. Unable to view TM  Eyes: Conjunctivae and EOM are normal. Pupils are equal, round, and reactive to light.  Neck: Normal range of motion.  Cardiovascular: Normal rate, regular rhythm, normal heart sounds and intact distal pulses.   Pulmonary/Chest: Effort normal and breath sounds normal.  Abdominal: Soft. Bowel sounds are normal.  Musculoskeletal: Normal range of motion.  Neurological: He is alert and oriented to person, place, and time.  Skin: Skin is warm and dry.  Psychiatric: He has a normal mood and affect. His behavior is normal. Thought content normal.     BP 121/77 mmHg  Pulse 83  Temp(Src) 96.9 F (36.1 C) (Oral)  Ht '5\' 9"'  (1.753 m)  Wt 179 lb (81.194 kg)  BMI 26.42 kg/m2  Results for orders placed or performed in visit on 09/10/15  POCT glycosylated hemoglobin (Hb A1C)  Result Value Ref Range   Hemoglobin A1C 8.7         Assessment & Plan:  1. Hyperlipidemia Low fat diet - Lipid panel - gemfibrozil (LOPID) 600 MG  tablet; Take 1 tablet (600 mg total) by mouth 2 (two) times daily before a meal.  Dispense: 90 tablet; Refill: 1  2. Essential hypertension Do nt add salt to diet - CMP14+EGFR - lisinopril (PRINIVIL,ZESTRIL) 5 MG tablet; Take 1  tablet (5 mg total) by mouth daily.  Dispense: 90 tablet; Refill: 1  3. BPH (benign prostatic hyperplasia) - tamsulosin (FLOMAX) 0.4 MG CAPS capsule; TAKE ONE CAPSULE BY MOUTH EVERY DAY  Dispense: 90 capsule; Refill: 0  4. Erectile dysfunction due to diseases classified elsewhere  5. BMI 26.0-26.9,adult Discussed diet and exercise for person with BMI >25 Will recheck weight in 3-6 months  6. Screening for malignant neoplasm of the rectum - Fecal occult blood, imunochemical; Future  7. Type 2 diabetes mellitus with hyperglycemia, without long-term current use of insulin (HCC) hgba1c is improving- need to continue to watch carbs- no change in meds today - POCT glycosylated hemoglobin (Hb A1C) - sitaGLIPtin-metformin (JANUMET) 50-1000 MG tablet; Take 1 tablet by mouth 2 (two) times daily with a meal.  Dispense: 180 tablet; Refill: 1 - glimepiride (AMARYL) 4 MG tablet; Take 1 tablet (4 mg total) by mouth daily before breakfast.  Dispense: 90 tablet; Refill: 1   Counseling done on all immunizations given today- flu Labs pending Health maintenance reviewed Diet and exercise encouraged Continue all meds Follow up  In 3 months    [place], FNP

## 2015-09-10 NOTE — Patient Instructions (Signed)

## 2015-09-11 LAB — CMP14+EGFR
A/G RATIO: 1.4 (ref 1.1–2.5)
ALT: 10 IU/L (ref 0–44)
AST: 17 IU/L (ref 0–40)
Albumin: 4.1 g/dL (ref 3.5–4.8)
Alkaline Phosphatase: 90 IU/L (ref 39–117)
BUN/Creatinine Ratio: 15 (ref 10–22)
BUN: 15 mg/dL (ref 8–27)
Bilirubin Total: 0.2 mg/dL (ref 0.0–1.2)
CALCIUM: 9.5 mg/dL (ref 8.6–10.2)
CHLORIDE: 100 mmol/L (ref 97–106)
CO2: 23 mmol/L (ref 18–29)
Creatinine, Ser: 1.03 mg/dL (ref 0.76–1.27)
GFR calc Af Amer: 84 mL/min/{1.73_m2} (ref >59)
GFR, EST NON AFRICAN AMERICAN: 72 mL/min/{1.73_m2} (ref >59)
Globulin, Total: 2.9 g/dL (ref 1.5–4.5)
Glucose: 98 mg/dL (ref 65–99)
POTASSIUM: 4.5 mmol/L (ref 3.5–5.2)
Sodium: 140 mmol/L (ref 136–144)
Total Protein: 7 g/dL (ref 6.0–8.5)

## 2015-09-11 LAB — LIPID PANEL
CHOL/HDL RATIO: 3.2 ratio (ref 0.0–5.0)
CHOLESTEROL TOTAL: 144 mg/dL (ref 100–199)
HDL: 45 mg/dL (ref >39)
LDL Calculated: 73 mg/dL (ref 0–99)
TRIGLYCERIDES: 130 mg/dL (ref 0–149)
VLDL Cholesterol Cal: 26 mg/dL (ref 5–40)

## 2015-09-12 ENCOUNTER — Encounter: Payer: Self-pay | Admitting: *Deleted

## 2015-09-13 ENCOUNTER — Other Ambulatory Visit: Payer: Self-pay | Admitting: Pharmacist

## 2015-12-10 ENCOUNTER — Ambulatory Visit (INDEPENDENT_AMBULATORY_CARE_PROVIDER_SITE_OTHER): Payer: Commercial Managed Care - HMO

## 2015-12-10 ENCOUNTER — Encounter: Payer: Self-pay | Admitting: Nurse Practitioner

## 2015-12-10 ENCOUNTER — Ambulatory Visit (INDEPENDENT_AMBULATORY_CARE_PROVIDER_SITE_OTHER): Payer: Commercial Managed Care - HMO | Admitting: Nurse Practitioner

## 2015-12-10 VITALS — BP 125/70 | HR 74 | Temp 96.8°F | Ht 69.0 in | Wt 180.0 lb

## 2015-12-10 DIAGNOSIS — E785 Hyperlipidemia, unspecified: Secondary | ICD-10-CM | POA: Diagnosis not present

## 2015-12-10 DIAGNOSIS — Z720 Tobacco use: Secondary | ICD-10-CM

## 2015-12-10 DIAGNOSIS — N400 Enlarged prostate without lower urinary tract symptoms: Secondary | ICD-10-CM

## 2015-12-10 DIAGNOSIS — E1165 Type 2 diabetes mellitus with hyperglycemia: Secondary | ICD-10-CM

## 2015-12-10 DIAGNOSIS — I10 Essential (primary) hypertension: Secondary | ICD-10-CM | POA: Diagnosis not present

## 2015-12-10 DIAGNOSIS — E119 Type 2 diabetes mellitus without complications: Secondary | ICD-10-CM | POA: Diagnosis not present

## 2015-12-10 DIAGNOSIS — F17200 Nicotine dependence, unspecified, uncomplicated: Secondary | ICD-10-CM

## 2015-12-10 LAB — POCT GLYCOSYLATED HEMOGLOBIN (HGB A1C): Hemoglobin A1C: 8.3

## 2015-12-10 MED ORDER — INSULIN DEGLUDEC 100 UNIT/ML ~~LOC~~ SOPN: 15.0000 [IU] | PEN_INJECTOR | Freq: Every day | SUBCUTANEOUS | Status: AC

## 2015-12-10 MED ORDER — GEMFIBROZIL 600 MG PO TABS: 600.0000 mg | ORAL_TABLET | Freq: Two times a day (BID) | ORAL | Status: AC

## 2015-12-10 MED ORDER — LISINOPRIL 5 MG PO TABS: 5.0000 mg | ORAL_TABLET | Freq: Every day | ORAL | Status: AC

## 2015-12-10 MED ORDER — TAMSULOSIN HCL 0.4 MG PO CAPS: ORAL_CAPSULE | ORAL | Status: AC

## 2015-12-10 MED ORDER — SITAGLIPTIN PHOS-METFORMIN HCL 50-1000 MG PO TABS: 1.0000 | ORAL_TABLET | Freq: Two times a day (BID) | ORAL | Status: AC

## 2015-12-10 MED ORDER — GLIMEPIRIDE 4 MG PO TABS: ORAL_TABLET | ORAL | Status: AC

## 2015-12-10 NOTE — Progress Notes (Addendum)
Subjective:    Patient ID: Gabriel Joyce, male    DOB: 08-13-1941, 75 y.o.   MRN: [id number]   Patient here today for follow up of chronic medical problems.  Outpatient Encounter Prescriptions as of 12/10/2015  Medication Sig  . gemfibrozil (LOPID) 600 MG tablet Take 1 tablet (600 mg total) by mouth 2 (two) times daily before a meal.  . glimepiride (AMARYL) 4 MG tablet Take 1 tablet (4 mg total) by mouth daily before breakfast.  . glucose blood (ONETOUCH VERIO) test strip Test blood sugar every morning  And prn  E 11.3  . Insulin Pen Needle (PEN NEEDLES) 31G X 6 MM MISC Use to inject insulin once daly  . lisinopril (PRINIVIL,ZESTRIL) 5 MG tablet Take 1 tablet (5 mg total) by mouth daily.  . meloxicam (MOBIC) 15 MG tablet TAKE ONE TABLET BY MOUTH EVERY DAY  . ONE TOUCH LANCETS MISC Use to check BG once daily and as needed DX: E11.9 (type 2 DM)  . sildenafil (VIAGRA) 50 MG tablet Take 1 tablet (50 mg total) by mouth daily as needed for erectile dysfunction.  . sitaGLIPtin-metformin (JANUMET) 50-1000 MG tablet Take 1 tablet by mouth 2 (two) times daily with a meal.  . tamsulosin (FLOMAX) 0.4 MG CAPS capsule TAKE ONE CAPSULE BY MOUTH EVERY DAY  . TRESIBA FLEXTOUCH 100 UNIT/ML SOPN INJECT 10 UNITS INTO THE SKIN DAILY WITH BREAKFAST   No facility-administered encounter medications on file as of 12/10/2015.    Hypertension This is a chronic problem. The current episode started more than 1 year ago. The problem is unchanged. The problem is controlled. Pertinent negatives include no chest pain, neck pain or palpitations. Risk factors for coronary artery disease include dyslipidemia and male gender. Past treatments include ACE inhibitors. The current treatment provides moderate improvement. Compliance problems include diet and exercise.   Hyperlipidemia This is a chronic problem. The current episode started more than 1 year ago. Recent lipid tests were reviewed and are variable. Exacerbating  diseases include diabetes. He has no history of hypothyroidism or obesity. Pertinent negatives include no chest pain. Current antihyperlipidemic treatment includes statins. The current treatment provides moderate improvement of lipids. Compliance problems include adherence to diet and adherence to exercise.  Risk factors for coronary artery disease include dyslipidemia, hypertension and male sex.  Diabetes He presents for his follow-up diabetic visit. He has type 2 diabetes mellitus. Pertinent negatives for hypoglycemia include no dizziness. Pertinent negatives for diabetes include no chest pain and no fatigue. Risk factors for coronary artery disease include dyslipidemia, hypertension and male sex. Current diabetic treatment includes oral agent (dual therapy). He is compliant with treatment all of the time. When asked about meal planning, he reported none. He has not had a previous visit with a dietitian. He never participates in exercise. (Does not check blood sugars at home) An ACE inhibitor/angiotensin II receptor blocker is being taken. He does not see a podiatrist.Eye exam is not current.  BPH Currently on flomax- denies any trouble passing water ED Uses viagra when needed. Has not used in awhile   Review of Systems  Constitutional: Negative.  Negative for fatigue.  HENT: Negative.   Cardiovascular: Negative for chest pain and palpitations.  Genitourinary: Negative.   Musculoskeletal: Negative.  Negative for neck pain.  Neurological: Negative for dizziness.  Psychiatric/Behavioral: Negative.   All other systems reviewed and are negative.      Objective:   Physical Exam  Constitutional: He is oriented to person, place, and  time. He appears well-developed and well-nourished.  HENT:  Head: Normocephalic.  Right Ear: External ear normal.  Left Ear: External ear normal.  Nose: Nose normal.  Mouth/Throat: Oropharynx is clear and moist.  Cerumen noted to right ear. Unable to view TM    Eyes: Conjunctivae and EOM are normal. Pupils are equal, round, and reactive to light.  Neck: Normal range of motion.  Cardiovascular: Normal rate, regular rhythm, normal heart sounds and intact distal pulses.   Pulmonary/Chest: Effort normal and breath sounds normal.  Abdominal: Soft. Bowel sounds are normal.  Musculoskeletal: Normal range of motion.  Neurological: He is alert and oriented to person, place, and time.  Skin: Skin is warm and dry.  Psychiatric: He has a normal mood and affect. His behavior is normal. Thought content normal.     BP 125/70 mmHg  Pulse 74  Temp(Src) 96.8 F (36 C) (Oral)  Ht '5\' 9"'  (1.753 m)  Wt 180 lb (81.647 kg)  BMI 26.57 kg/m2  Results for orders placed or performed in visit on 12/10/15  POCT glycosylated hemoglobin (Hb A1C)  Result Value Ref Range   Hemoglobin A1C 8.3    Chest x ray- chronic bronchitic changes-Preliminary reading by Ronnald Collum, FNP  [facility]   EKG- Kerry Hough, FNP      Assessment & Plan:  1. Hyperlipidemia  low fat diet - Lipid panel - gemfibrozil (LOPID) 600 MG tablet; Take 1 tablet (600 mg total) by mouth 2 (two) times daily before a meal.  Dispense: 90 tablet; Refill: 1  2. Essential hypertension Do not add salt to diet - CMP14+EGFR - lisinopril (PRINIVIL,ZESTRIL) 5 MG tablet; Take 1 tablet (5 mg total) by mouth daily.  Dispense: 90 tablet; Refill: 1 - EKG 12-Lead  3. Type 2 diabetes mellitus without complication, without long-term current use of insulin (HCC) Stricter carb counting Increased tresiba to 15 u daily - POCT glycosylated hemoglobin (Hb A1C) - Insulin Degludec (TRESIBA FLEXTOUCH) 100 UNIT/ML SOPN; Inject 15 Units into the skin daily.  Dispense: 15 pen; Refill: 2 - sitaGLIPtin-metformin (JANUMET) 50-1000 MG tablet; Take 1 tablet by mouth 2 (two) times daily with a meal.  Dispense: 180 tablet; Refill: 1 - glimepiride (AMARYL) 4 MG tablet; 1 tablet qd  Dispense: 90 tablet; Refill: 1   4.  BPH (benign prostatic hyperplasia) - tamsulosin (FLOMAX) 0.4 MG CAPS capsule; TAKE ONE CAPSULE BY MOUTH EVERY DAY  Dispense: 90 capsule; Refill: 0  5. Smoker Smoking cessation encouraged - DG Chest 2 View; Future   Patient misplaced hemoccult cards given at last visit- gave some new cards Labs pending Health maintenance reviewed Diet and exercise encouraged Continue all meds Follow up  In 3 months  [place], FNP

## 2015-12-10 NOTE — Patient Instructions (Signed)

## 2015-12-11 LAB — CMP14+EGFR
ALK PHOS: 89 IU/L (ref 39–117)
ALT: 7 IU/L (ref 0–44)
AST: 8 IU/L (ref 0–40)
Albumin/Globulin Ratio: 1.6 (ref 1.1–2.5)
Albumin: 3.9 g/dL (ref 3.5–4.8)
BILIRUBIN TOTAL: 0.2 mg/dL (ref 0.0–1.2)
BUN / CREAT RATIO: 13 (ref 10–22)
BUN: 13 mg/dL (ref 8–27)
CHLORIDE: 96 mmol/L (ref 96–106)
CO2: 22 mmol/L (ref 18–29)
CREATININE: 0.97 mg/dL (ref 0.76–1.27)
Calcium: 9.2 mg/dL (ref 8.6–10.2)
GFR calc Af Amer: 89 mL/min/{1.73_m2} (ref >59)
GFR calc non Af Amer: 77 mL/min/{1.73_m2} (ref >59)
GLOBULIN, TOTAL: 2.5 g/dL (ref 1.5–4.5)
GLUCOSE: 293 mg/dL — AB (ref 65–99)
Potassium: 4.5 mmol/L (ref 3.5–5.2)
SODIUM: 135 mmol/L (ref 134–144)
Total Protein: 6.4 g/dL (ref 6.0–8.5)

## 2015-12-11 LAB — LIPID PANEL
CHOLESTEROL TOTAL: 139 mg/dL (ref 100–199)
Chol/HDL Ratio: 3.6 ratio units (ref 0.0–5.0)
HDL: 39 mg/dL — ABNORMAL LOW (ref >39)
LDL CALC: 65 mg/dL (ref 0–99)
TRIGLYCERIDES: 176 mg/dL — AB (ref 0–149)
VLDL Cholesterol Cal: 35 mg/dL (ref 5–40)

## 2016-03-12 ENCOUNTER — Ambulatory Visit: Payer: Commercial Managed Care - HMO | Admitting: Nurse Practitioner

## 2016-03-12 DEATH — deceased

## 2017-03-25 IMAGING — CR DG CHEST 2V
2 series · 2 of 2 positions shown · non-contrast
Comparison: None in PACs

CLINICAL DATA: Current smoker, hyperlipidemia, hypertension,
diabetes

EXAM:
CHEST  2 VIEW

[view not recorded (1 of 2)]
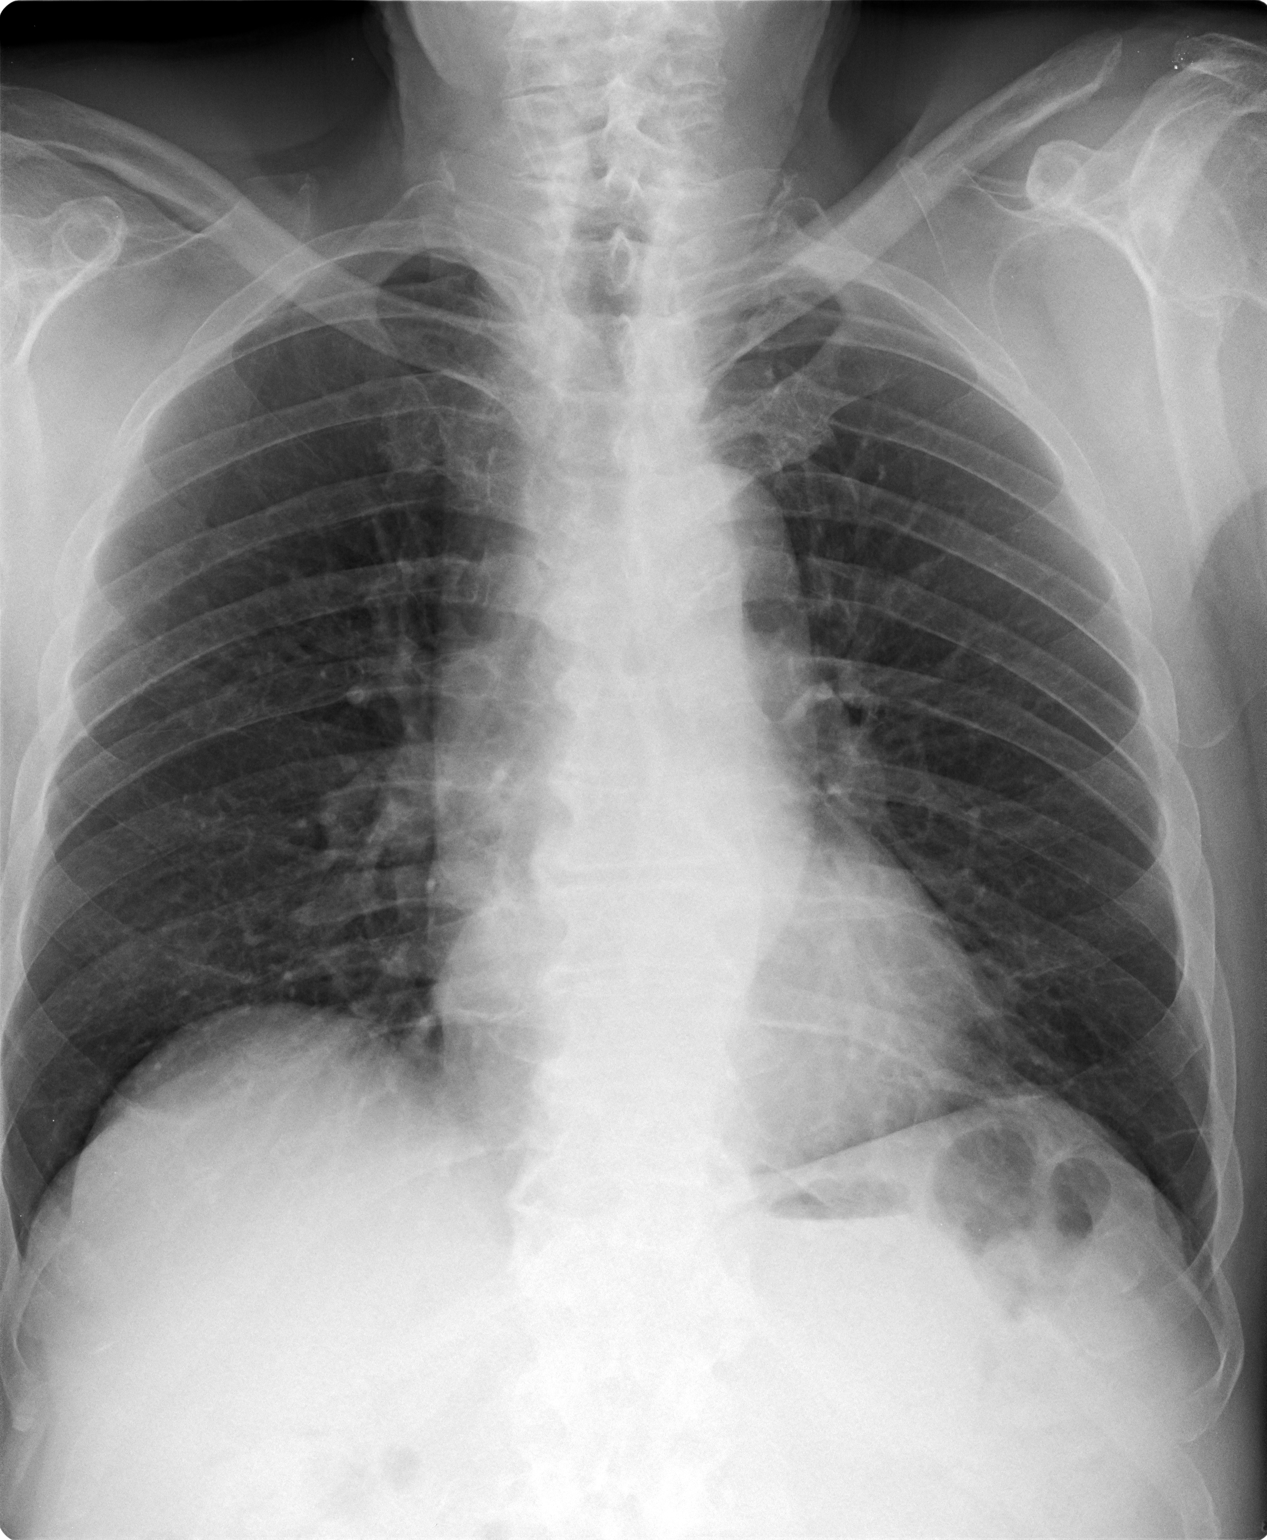

[view not recorded (2 of 2)]
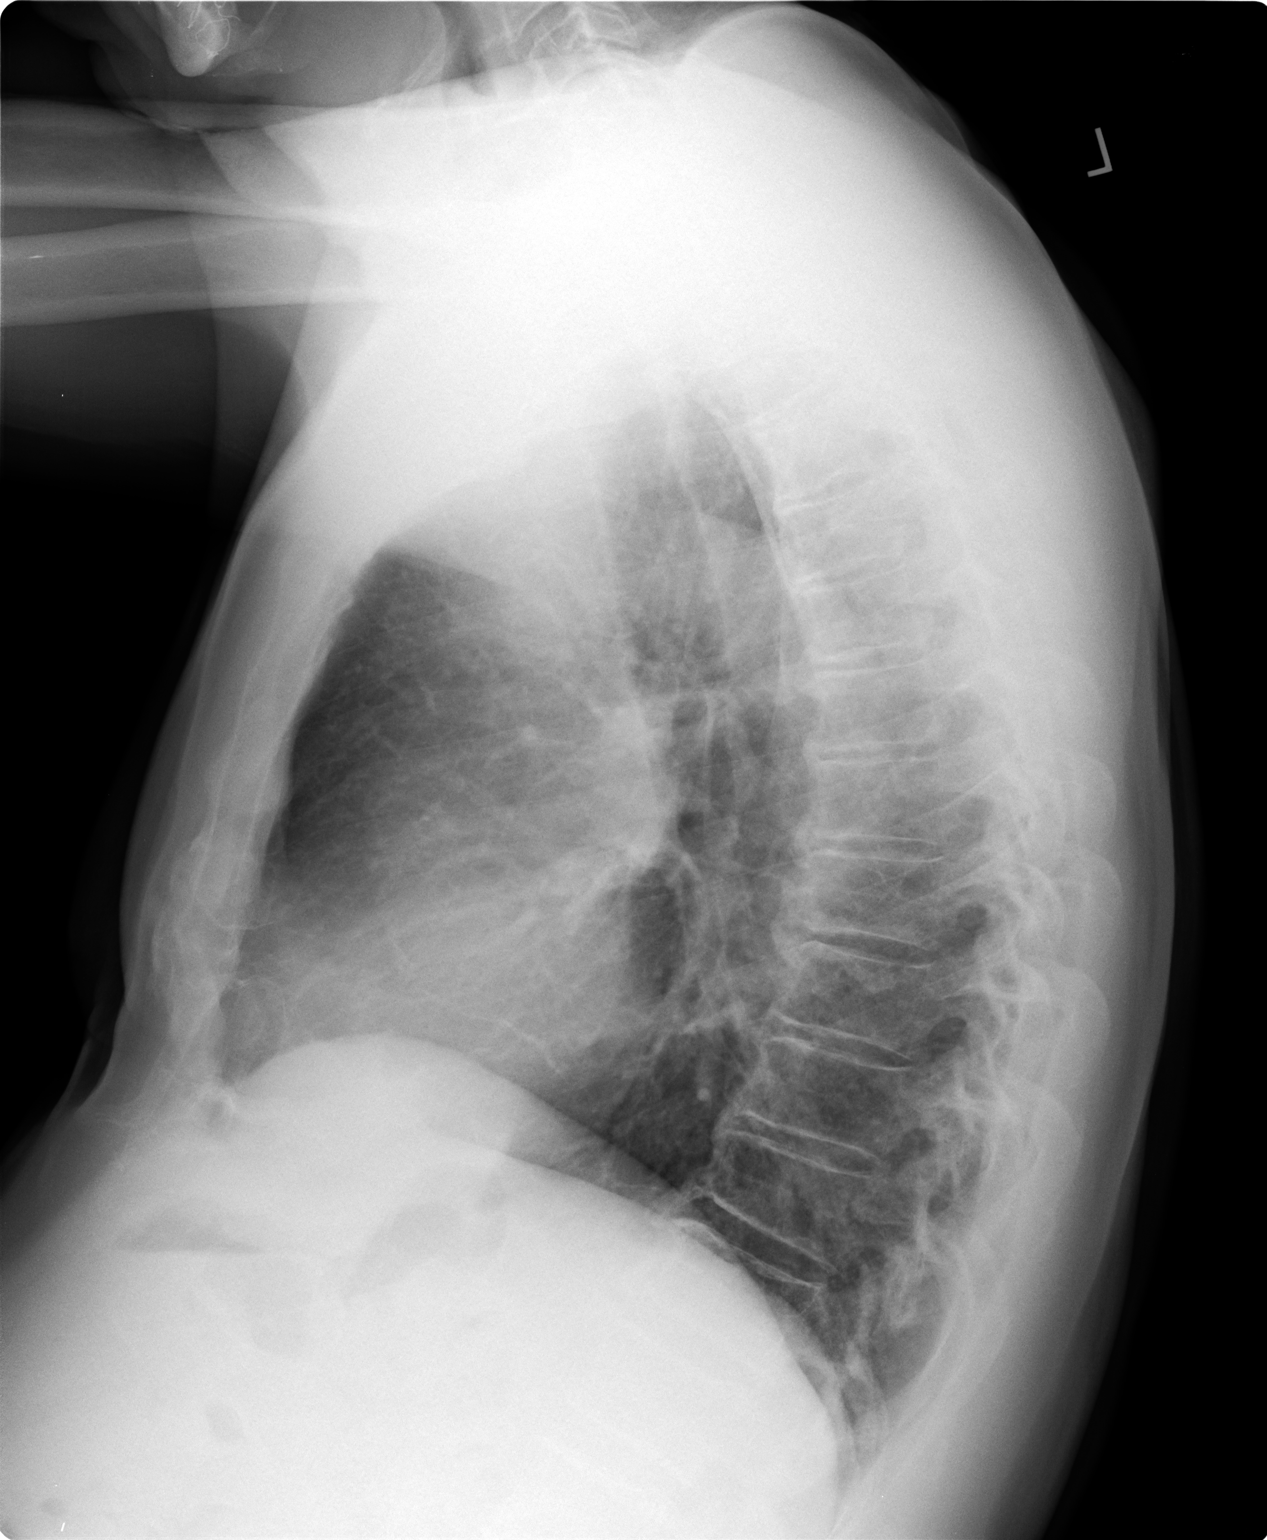

[2 of 2 positions shown; findings below may reference images not displayed]

FINDINGS: The lungs are adequately inflated. There is no focal infiltrate. The
interstitial markings are coarse. The heart and pulmonary
vascularity are normal. The mediastinum is normal width. There is
tortuosity of the descending thoracic aorta.
IMPRESSION: There is no acute cardiopulmonary abnormality.
